# Patient Record
Sex: Male | Born: 1960 | Race: Black or African American | Hispanic: No | Marital: Married | Smoking: Current every day smoker
Health system: Southern US, Community
[De-identification: ages and names within clinical notes are randomized; demographics above are authoritative.]

## PROBLEM LIST (undated history)

## (undated) DIAGNOSIS — M1990 Unspecified osteoarthritis, unspecified site: Secondary | ICD-10-CM

---

## 1998-11-14 ENCOUNTER — Emergency Department (HOSPITAL_COMMUNITY): Admission: EM | Admit: 1998-11-14 | Discharge: 1998-11-14 | Payer: Self-pay | Admitting: Emergency Medicine

## 2014-02-26 ENCOUNTER — Emergency Department (INDEPENDENT_AMBULATORY_CARE_PROVIDER_SITE_OTHER)
Admission: EM | Admit: 2014-02-26 | Discharge: 2014-02-26 | Disposition: A | Discharge status: Home / Self Care | Payer: BC Managed Care – PPO | Source: Home / Self Care | Attending: Family Medicine | Admitting: Family Medicine

## 2014-02-26 ENCOUNTER — Encounter: Payer: Self-pay | Admitting: Emergency Medicine

## 2014-02-26 DIAGNOSIS — L0201 Cutaneous abscess of face: Secondary | ICD-10-CM

## 2014-02-26 DIAGNOSIS — L03211 Cellulitis of face: Principal | ICD-10-CM

## 2014-02-26 MED ORDER — DOXYCYCLINE HYCLATE 100 MG PO CAPS: 100.0000 mg | ORAL_CAPSULE | Freq: Two times a day (BID) | ORAL | Status: DC

## 2014-02-26 NOTE — ED Provider Notes (Signed)
CSN: [id number]     Arrival date & time 02/26/14  1913 History   First MD Initiated Contact with Patient 02/26/14 1923     Chief Complaint  Patient presents with  . Skin Problem      HPI Comments: Patient complains of onset of a painful bump on the left side of his face two days ago.  There has been no drainage from the lesion.  Patient is a 53 y.o. male presenting with rash. The history is provided by the patient.  Rash Pain location: left face. Pain quality: aching   Pain severity:  Mild Onset quality:  Sudden Duration:  2 days Timing:  Constant Progression:  Worsening Chronicity:  New Relieved by:  Nothing Worsened by:  Palpation Ineffective treatments:  None tried Associated symptoms: no chills, no fatigue and no fever     History reviewed. No pertinent past medical history. History reviewed. No pertinent past surgical history. Family History  Problem Relation Age of Onset  . Cancer Mother    History  Substance Use Topics  . Smoking status: Current Every Day Smoker -- 1.00 packs/day    Types: Cigarettes  . Smokeless tobacco: Never Used  . Alcohol Use: No    Review of Systems  Constitutional: Negative for fever, chills and fatigue.  Skin: Positive for rash.  All other systems reviewed and are negative.   Allergies  Review of patient's allergies indicates no known allergies.  Home Medications   Prior to Admission medications   Not on File   BP 137/86  Pulse 94  Temp(Src) 98.1 F (36.7 C) (Oral)  Resp 14  Ht 6\' 2"  (1.88 m)  Wt 168 lb (76.204 kg)  BMI 21.56 kg/m2  SpO2 98% Physical Exam  Nursing note and vitals reviewed. Constitutional: He appears well-developed and well-nourished. No distress.  HENT:  Head: Normocephalic.    Nose: Nose normal.  Mouth/Throat: Oropharynx is clear and moist.  Just lateral to the left eyebrow, as noted on diagram,   is a 1.5cm tender erythematous indurated nodule, not fluctuant.  No swelling or tenderness of left  eyelid.  Eyes: Conjunctivae are normal. Pupils are equal, round, and reactive to light.  Lymphadenopathy:    He has no cervical adenopathy.    ED Course  Procedures  none      MDM   1. Cellulitis and abscess of face    Begin doxycycline. Apply warm compress several times daily.  Return for worsening symptoms (pain, swelling, redness, etc.) Return if not significantly improved in 48 hours.    [name], MD 02/28/14 [phone]

## 2014-02-26 NOTE — Discharge Instructions (Signed)
Apply warm compress several times daily.  Return for worsening symptoms (pain, swelling, redness, etc.)   Cellulitis Cellulitis is an infection of the skin and the tissue beneath it. The infected area is usually red and tender. Cellulitis occurs most often in the arms and lower legs.  CAUSES  Cellulitis is caused by bacteria that enter the skin through cracks or cuts in the skin. The most common types of bacteria that cause cellulitis are Staphylococcus and Streptococcus. SYMPTOMS   Redness and warmth.  Swelling.  Tenderness or pain.  Fever. DIAGNOSIS  Your caregiver can usually determine what is wrong based on a physical exam. Blood tests may also be done. TREATMENT  Treatment usually involves taking an antibiotic medicine. HOME CARE INSTRUCTIONS   Take your antibiotics as directed. Finish them even if you start to feel better.  Keep the infected arm or leg elevated to reduce swelling.  Apply a warm cloth to the affected area up to 4 times per day to relieve pain.  Only take over-the-counter or prescription medicines for pain, discomfort, or fever as directed by your caregiver.  Keep all follow-up appointments as directed by your caregiver. SEEK MEDICAL CARE IF:   You notice red streaks coming from the infected area.  Your red area gets larger or turns dark in color.  Your bone or joint underneath the infected area becomes painful after the skin has healed.  Your infection returns in the same area or another area.  You notice a swollen bump in the infected area.  You develop new symptoms. SEEK IMMEDIATE MEDICAL CARE IF:   You have a fever.  You feel very sleepy.  You develop vomiting or diarrhea.  You have a general ill feeling (malaise) with muscle aches and pains. MAKE SURE YOU:   Understand these instructions.  Will watch your condition.  Will get help right away if you are not doing well or get worse. Document Released: 08/08/2005 Document Revised:  04/29/2012 Document Reviewed: 01/14/2012 [facility] Patient Information 2014 [place].

## 2014-02-26 NOTE — ED Notes (Signed)
Jaskaran c/o bump tp left saide of face x 2 days, started swelling today. Has not affected vision. Wears safety glasses @ work that covers this area.

## 2014-02-28 ENCOUNTER — Emergency Department (INDEPENDENT_AMBULATORY_CARE_PROVIDER_SITE_OTHER)
Admission: EM | Admit: 2014-02-28 | Discharge: 2014-02-28 | Disposition: A | Discharge status: Home / Self Care | Payer: BC Managed Care – PPO | Source: Home / Self Care | Attending: Family Medicine | Admitting: Family Medicine

## 2014-02-28 ENCOUNTER — Encounter: Payer: Self-pay | Admitting: Emergency Medicine

## 2014-02-28 DIAGNOSIS — L03211 Cellulitis of face: Principal | ICD-10-CM

## 2014-02-28 DIAGNOSIS — L0201 Cutaneous abscess of face: Secondary | ICD-10-CM

## 2014-02-28 NOTE — Discharge Instructions (Signed)
Leave bandage in place until follow-up visit tomorrow.  Continue antibiotic.  May take Tylenol for pain.   Abscess An abscess is an infected area that contains a collection of pus and debris.It can occur in almost any part of the body. An abscess is also known as a furuncle or boil. CAUSES  An abscess occurs when tissue gets infected. This can occur from blockage of oil or sweat glands, infection of hair follicles, or a minor injury to the skin. As the body tries to fight the infection, pus collects in the area and creates pressure under the skin. This pressure causes pain. People with weakened immune systems have difficulty fighting infections and get certain abscesses more often.  SYMPTOMS Usually an abscess develops on the skin and becomes a painful mass that is red, warm, and tender. If the abscess forms under the skin, you may feel a moveable soft area under the skin. Some abscesses break open (rupture) on their own, but most will continue to get worse without care. The infection can spread deeper into the body and eventually into the bloodstream, causing you to feel ill.  DIAGNOSIS  Your caregiver will take your medical history and perform a physical exam. A sample of fluid may also be taken from the abscess to determine what is causing your infection. TREATMENT  Your caregiver may prescribe antibiotic medicines to fight the infection. However, taking antibiotics alone usually does not cure an abscess. Your caregiver may need to make a small cut (incision) in the abscess to drain the pus. In some cases, gauze is packed into the abscess to reduce pain and to continue draining the area. HOME CARE INSTRUCTIONS   Only take over-the-counter or prescription medicines for pain, discomfort, or fever as directed by your caregiver.  If you were prescribed antibiotics, take them as directed. Finish them even if you start to feel better.  If gauze is used, follow your caregiver's directions for  changing the gauze.  To avoid spreading the infection:  Keep your draining abscess covered with a bandage.  Wash your hands well.  Do not share personal care items, towels, or whirlpools with others.  Avoid skin contact with others.  Keep your skin and clothes clean around the abscess.  Keep all follow-up appointments as directed by your caregiver. SEEK MEDICAL CARE IF:   You have increased pain, swelling, redness, fluid drainage, or bleeding.  You have muscle aches, chills, or a general ill feeling.  You have a fever. MAKE SURE YOU:   Understand these instructions.  Will watch your condition.  Will get help right away if you are not doing well or get worse. Document Released: 08/08/2005 Document Revised: 04/29/2012 Document Reviewed: 01/11/2012 [facility] Patient Information 2014 [place].

## 2014-02-28 NOTE — ED Notes (Signed)
Pt is back today for a bump on his face near his L eye.  He was seen 2 days ago and it has not improved but has not gotten worse.  Sore to touch

## 2014-02-28 NOTE — ED Provider Notes (Signed)
CSN: [id number]     Arrival date & time 02/28/14  1110 History   First MD Initiated Contact with Patient 02/28/14 1143     Chief Complaint  Patient presents with  . Mass     HPI Comments: Patient returns for follow-up of a cystic lesion on his left face.  He reports that the lesion is slightly larger and more painful.  He feels well otherwise.                                                                                                                                                                                      The history is provided by the patient.    History reviewed. No pertinent past medical history. History reviewed. No pertinent past surgical history. Family History  Problem Relation Age of Onset  . Cancer Mother    History  Substance Use Topics  . Smoking status: Current Every Day Smoker -- 1.00 packs/day    Types: Cigarettes  . Smokeless tobacco: Never Used  . Alcohol Use: No    Review of Systems No fevers, chills, and sweats  Allergies  Review of patient's allergies indicates no known allergies.  Home Medications   Prior to Admission medications   Medication Sig Start Date End Date Taking? Authorizing Provider  doxycycline (VIBRAMYCIN) 100 MG capsule Take 1 capsule (100 mg total) by mouth 2 (two) times daily. 02/26/14   [name], MD   BP 121/78  Pulse 90  Temp(Src) 97.7 F (36.5 C) (Oral)  Ht 6\' 2"  (1.88 m)  Wt 168 lb 8 oz (76.431 kg)  BMI 21.62 kg/m2  SpO2 97% Physical Exam Nursing notes and Vital Signs reviewed. Appearance:  Patient appears healthy, stated age, and in no acute distress Eyes:  Pupils are equal, round, and reactive to light and accomodation.  Extraocular movement is intact.  Conjunctivae are not inflamed.  No left eyelid tenderness or swelling. Head:  There is a persistent 1.5cm dia tender fluctuant erythematous cystic lesion lateral to the left eye as noted in previous note.  .   ED Course  Procedures  Incise and drain  cyst/abscess Risks and benefits of procedure explained to patient and verbal consent obtained.  Using sterile technique and local anesthesia with 1% lidocaine with epinephrine, cleansed affected area with Betadine and alcohol. Identified the most fluctuant area of lesion and incised with #11 blade.  Expressed blood and purulent material.  Inserted Iodoform gauze packing.  Bandage applied.  Patient tolerated well       MDM   1. Cellulitis and abscess of face     Leave bandage in place until follow-up visit tomorrow.  Continue antibiotic.  May take Tylenol for pain. Return tomorrow.    [name], MD 02/28/14 [phone]

## 2014-03-01 ENCOUNTER — Encounter: Payer: Self-pay | Admitting: Emergency Medicine

## 2014-03-01 ENCOUNTER — Emergency Department
Admission: EM | Admit: 2014-03-01 | Discharge: 2014-03-01 | Disposition: A | Discharge status: Home / Self Care | Payer: BC Managed Care – PPO | Source: Home / Self Care | Attending: Family Medicine | Admitting: Family Medicine

## 2014-03-01 DIAGNOSIS — Z4801 Encounter for change or removal of surgical wound dressing: Secondary | ICD-10-CM

## 2014-03-01 NOTE — Discharge Instructions (Signed)
Change dressing daily until healed.  Continue antibiotic

## 2014-03-01 NOTE — ED Notes (Signed)
The pt is here today for a recheck of his facial abscess from 02/28/14.

## 2014-03-01 NOTE — ED Provider Notes (Signed)
CSN: [id number]     Arrival date & time 03/01/14  55 History   First MD Initiated Contact with Patient 03/01/14 1810     Chief Complaint  Patient presents with  . Wound Check      HPI Comments: Patient returns for wound packing removal and dressing change.  He has no complaints.  The history is provided by the patient.    History reviewed. No pertinent past medical history. History reviewed. No pertinent past surgical history. Family History  Problem Relation Age of Onset  . Cancer Mother    History  Substance Use Topics  . Smoking status: Current Every Day Smoker -- 1.00 packs/day    Types: Cigarettes  . Smokeless tobacco: Never Used  . Alcohol Use: No    Review of Systems No fevers, chills, and sweats.  Patient reports no pain at I and D site left face. Allergies  Review of patient's allergies indicates no known allergies.  Home Medications   Prior to Admission medications   Medication Sig Start Date End Date Taking? Authorizing Provider  doxycycline (VIBRAMYCIN) 100 MG capsule Take 1 capsule (100 mg total) by mouth 2 (two) times daily. 02/26/14   [name], MD   BP 114/76  Pulse 86  Temp(Src) 98.6 F (37 C) (Oral)  Resp 16  SpO2 96% Physical Exam Nursing notes and Vital Signs reviewed. Appearance:  Patient appears healthy, stated age, and in no acute distress Head:  I and D site left face with minimal erythema, swelling, and tenderness.  Packing removed:  Small amount of purulent drainage present, and wound cavity shallow.  No further packing indicated.  Bandage applied. ED Course  Procedures  none      MDM   1. Dressing change or removal, surgical wound    Wound culture pending. Continue doxycycline.  Change dressing daily.  Return if worsens or not healed 5 to 7 days.    [name], MD 03/01/14 [phone]

## 2014-03-04 LAB — WOUND CULTURE: GRAM STAIN: NONE SEEN

## 2014-03-05 ENCOUNTER — Telehealth: Payer: Self-pay | Admitting: *Deleted

## 2014-04-20 ENCOUNTER — Emergency Department
Admission: EM | Admit: 2014-04-20 | Discharge: 2014-04-20 | Disposition: A | Discharge status: Home / Self Care | Payer: BC Managed Care – PPO | Source: Home / Self Care | Attending: Emergency Medicine | Admitting: Emergency Medicine

## 2014-04-20 ENCOUNTER — Encounter: Payer: Self-pay | Admitting: Emergency Medicine

## 2014-04-20 DIAGNOSIS — J209 Acute bronchitis, unspecified: Secondary | ICD-10-CM

## 2014-04-20 MED ORDER — AZITHROMYCIN 250 MG PO TABS: ORAL_TABLET | ORAL | Status: DC

## 2014-04-20 NOTE — ED Notes (Signed)
Pt c/o chest congestion with some coughing x 2 days. He is unsure if he has had a fever. He has taken Dayquil and Nyquil with some relief.

## 2014-04-20 NOTE — ED Provider Notes (Signed)
CSN: [id number]     Arrival date & time 04/20/14  1838 History   First MD Initiated Contact with Patient 04/20/14 1840     Chief Complaint  Patient presents with  . Cough    HPI URI HISTORY  Micheal Williams is a 53 y.o. male who complains of onset of cold symptoms for several days. Progressively worsening.  Have been using over-the-counter treatment , such as DayQuil, and NyQuil at bedtime, which helps . Cough does not keep him up at night.  He denies sneezing or allergy symptoms or itchy watery eyes  No chills/sweats +  Fever  +  Nasal congestion +  Discolored Post-nasal drainage No sinus pain/pressure No sore throat  +  Cough, productive of discolored sputum No wheezing + chest congestion No hemoptysis No shortness of breath No pleuritic pain  No itchy/red eyes No earache  No nausea No vomiting No abdominal pain No diarrhea  No skin rashes +  Fatigue No myalgias No headache   History reviewed. No pertinent past medical history. History reviewed. No pertinent past surgical history. Family History  Problem Relation Age of Onset  . Cancer Mother    History  Substance Use Topics  . Smoking status: Current Every Day Smoker -- 1.00 packs/day    Types: Cigarettes  . Smokeless tobacco: Never Used  . Alcohol Use: No    Review of Systems  All other systems reviewed and are negative.   Allergies  Review of patient's allergies indicates no known allergies.  Home Medications   Prior to Admission medications   Medication Sig Start Date End Date Taking? Authorizing Provider  azithromycin (ZITHROMAX Z-PAK) 250 MG tablet Take 2 tablets on day one, then 1 tablet daily on days 2 through 5 04/20/14   Jacqulyn Cane, MD   BP 128/86  Pulse 73  Temp(Src) 98.1 F (36.7 C) (Oral)  Resp 18  Ht 6\' 2"  (1.88 m)  Wt 170 lb (77.111 kg)  BMI 21.82 kg/m2  SpO2 98% Physical Exam  Nursing note and vitals reviewed. Constitutional: He is oriented to person, place, and time. He  appears well-developed and well-nourished. No distress.  HENT:  Head: Normocephalic and atraumatic.  Right Ear: Tympanic membrane normal.  Left Ear: Tympanic membrane normal.  Nose: Right sinus exhibits no maxillary sinus tenderness and no frontal sinus tenderness. Left sinus exhibits no maxillary sinus tenderness and no frontal sinus tenderness.  Mouth/Throat: Oropharynx is clear and moist. No oropharyngeal exudate.  Nose, within normal limits except some boggy turbinates and slight seromucoid drainage.  Eyes: Right eye exhibits no discharge. Left eye exhibits no discharge. No scleral icterus.  Neck: Neck supple.  Cardiovascular: Normal rate, regular rhythm and normal heart sounds.   Pulmonary/Chest: No respiratory distress. He has no wheezes. He has rhonchi. He has no rales.  Lymphadenopathy:    He has no cervical adenopathy.  Neurological: He is alert and oriented to person, place, and time.  Skin: Skin is warm and dry.    ED Course  Procedures (including critical care time) Labs Review Labs Reviewed - No data to display  Imaging Review No results found.   MDM   1. Acute bronchitis    Treatment options discussed, as well as risks, benefits, alternatives. Patient voiced understanding and agreement with the following plans: Z-Pak for bacterial and atypical coverage He declined any prescription cough medication, and he'll continue DayQuil and NyQuil as needed. I advised him to quit smoking. Follow-up with your primary care doctor in 5-7 days  if not improving, or sooner if symptoms become worse. Precautions discussed. Red flags discussed. Questions invited and answered. Patient voiced understanding and agreement.     Jacqulyn Cane, MD 04/20/14 Drema Halon

## 2014-04-25 ENCOUNTER — Emergency Department (INDEPENDENT_AMBULATORY_CARE_PROVIDER_SITE_OTHER): Payer: BC Managed Care – PPO

## 2014-04-25 ENCOUNTER — Encounter: Payer: Self-pay | Admitting: Emergency Medicine

## 2014-04-25 ENCOUNTER — Emergency Department (INDEPENDENT_AMBULATORY_CARE_PROVIDER_SITE_OTHER)
Admission: EM | Admit: 2014-04-25 | Discharge: 2014-04-25 | Disposition: A | Discharge status: Home / Self Care | Payer: BC Managed Care – PPO | Source: Home / Self Care | Attending: Emergency Medicine | Admitting: Emergency Medicine

## 2014-04-25 DIAGNOSIS — F17200 Nicotine dependence, unspecified, uncomplicated: Secondary | ICD-10-CM

## 2014-04-25 DIAGNOSIS — R059 Cough, unspecified: Secondary | ICD-10-CM

## 2014-04-25 DIAGNOSIS — J441 Chronic obstructive pulmonary disease with (acute) exacerbation: Secondary | ICD-10-CM

## 2014-04-25 DIAGNOSIS — R05 Cough: Secondary | ICD-10-CM

## 2014-04-25 MED ORDER — IPRATROPIUM-ALBUTEROL 0.5-2.5 (3) MG/3ML IN SOLN
3.0000 mL | Freq: Once | RESPIRATORY_TRACT | Status: AC
  Administered 2014-04-25: 3 mL via RESPIRATORY_TRACT

## 2014-04-25 MED ORDER — PREDNISONE 20 MG PO TABS: 20.0000 mg | ORAL_TABLET | Freq: Two times a day (BID) | ORAL | Status: DC

## 2014-04-25 MED ORDER — METHYLPREDNISOLONE ACETATE 80 MG/ML IJ SUSP
80.0000 mg | Freq: Once | INTRAMUSCULAR | Status: AC
  Administered 2014-04-25: 80 mg via INTRAMUSCULAR

## 2014-04-25 NOTE — ED Notes (Signed)
Pt complains of chest congestion was seen Tuesday and finished zpak not feeling better. Denies fever headaches chills.  Cough present and is productive white.

## 2014-04-25 NOTE — Discharge Instructions (Signed)
Today, chest x-ray within normal limits except early signs of emphysema (from smoking).  Treatment today in urgent care: Nebulizer treatment with bronchodilator. Depo-Medrol 80 mg shot.   Prescription: Prednisone 20 mg twice a day x7 days   QUITTING SMOKING SHOULD IMPROVE YOUR BREATHING AND CHEST CONGESTION. Follow-up with your primary care doctor in 5-7 days.

## 2014-04-25 NOTE — ED Provider Notes (Addendum)
CSN: [id number]     Arrival date & time 04/25/14  1440 History   First MD Initiated Contact with Patient 04/25/14 1441     Chief Complaint  Patient presents with  . Cough    HPI Complains of worsening chest congestion cough productive of whitish sputum. Was seen here 5 days ago, Z-Pak prescribed, and he feels that that hasn't helped significantly. He feels fatigued but no syncope or focal neurologic symptoms. Occasional mild dyspnea on exertion . No known wheezing. Denies chest pain. Denies headache, fever, chills.  He smokes a pack a day  + minimal Nasal congestion No Discolored Post-nasal drainage No sinus pain/pressure No sore throat  +  cough No wheezing + chest congestion No hemoptysis No shortness of breath No pleuritic pain  No itchy/red eyes No earache  No nausea No vomiting No abdominal pain No diarrhea  No skin rashes +  Fatigue No myalgias No headache   History reviewed. No pertinent past medical history. History reviewed. No pertinent past surgical history. Family History  Problem Relation Age of Onset  . Cancer Mother    History  Substance Use Topics  . Smoking status: Current Every Day Smoker -- 1.00 packs/day    Types: Cigarettes  . Smokeless tobacco: Never Used  . Alcohol Use: No    Review of Systems  All other systems reviewed and are negative.   Allergies  Review of patient's allergies indicates no known allergies.  Home Medications   Prior to Admission medications   Medication Sig Start Date End Date Taking? Authorizing Provider  azithromycin (ZITHROMAX Z-PAK) 250 MG tablet Take 2 tablets on day one, then 1 tablet daily on days 2 through 5 04/20/14   Jacqulyn Cane, MD  predniSONE (DELTASONE) 20 MG tablet Take 1 tablet (20 mg total) by mouth 2 (two) times daily with a meal. X 7 days 04/25/14   Jacqulyn Cane, MD   BP 125/82  Pulse 87  Temp(Src) 98.1 F (36.7 C) (Oral)  Ht 6\' 2"  (1.88 m)  Wt 165 lb (74.844 kg)  BMI 21.18 kg/m2  SpO2  96% Physical Exam  Nursing note and vitals reviewed. Constitutional: He is oriented to person, place, and time. He appears well-developed and well-nourished. No distress.  HENT:  Head: Normocephalic and atraumatic.  Right Ear: Tympanic membrane normal.  Left Ear: Tympanic membrane normal.  Nose: Right sinus exhibits no maxillary sinus tenderness and no frontal sinus tenderness. Left sinus exhibits no maxillary sinus tenderness and no frontal sinus tenderness.  Mouth/Throat: Oropharynx is clear and moist. No oropharyngeal exudate.  Nose, within normal limits except some boggy turbinates and slight seromucoid drainage.  Eyes: Right eye exhibits no discharge. Left eye exhibits no discharge. No scleral icterus.  Neck: Neck supple.  No adenopathy  Cardiovascular: Normal rate, regular rhythm and normal heart sounds.   Pulmonary/Chest: No respiratory distress. He has no wheezes. He has rhonchi. He has no rales.  Diffuse rhonchi. Mild late expiratory wheezes throughout  Lymphadenopathy:    He has no cervical adenopathy.  Neurological: He is alert and oriented to person, place, and time.  Skin: Skin is warm and dry.   Extremities: No calf tenderness or swelling or cords ED Course  Procedures (including critical care time) Labs Review Labs Reviewed - No data to display  Imaging Review Dg Chest 2 View  04/25/2014   CLINICAL DATA:  Cough, smoker.  EXAM: CHEST  2 VIEW  COMPARISON:  None.  FINDINGS: Hyperinflation of the lungs. Heart and  mediastinal contours are within normal limits. No focal opacities or effusions. No acute bony abnormality.  IMPRESSION: No active cardiopulmonary disease.  Hyperinflation.   Electronically Signed   By: Rolm Baptise M.D.   On: 04/25/2014 15:26     MDM   1. Acute exacerbation of chronic obstructive pulmonary disease (COPD)     Reviewed with him chest x-ray shows no active disease, but hyperinflation consistent with mild COPD. He likely has acute  bronchitis/exacerbation of COPD.--In my opinion, he does not need another course of antibiotic, as he just completed a Z-Pak, which continues to treat for another 5 days.(Also, no fever. Sputum without discoloration or blood)  Treatment options discussed, as well as risks, benefits, alternatives. Patient voiced understanding and agreement with the following plans: Depo-Medrol 80 mg IM stat DuoNeb nebulizer treatment given. Wheezing improved.--He felt much better. Prednisone oral burst 20 mg twice a day x7 days  I urged him to quit smoking and explained risks of not doing so.  An After Visit Summary was printed and given to the patient.  Follow-up with your primary care doctor in 5 days, or sooner if symptoms become worse. Precautions discussed. Red flags discussed. Questions invited and answered. Patient voiced understanding and agreement.    Jacqulyn Cane, MD 04/25/14 1555  Jacqulyn Cane, MD 04/25/14 [phone]

## 2014-08-18 ENCOUNTER — Emergency Department
Admission: EM | Admit: 2014-08-18 | Discharge: 2014-08-18 | Disposition: A | Discharge status: Home / Self Care | Payer: BC Managed Care – PPO | Source: Home / Self Care | Attending: Emergency Medicine | Admitting: Emergency Medicine

## 2014-08-18 ENCOUNTER — Encounter: Payer: Self-pay | Admitting: Emergency Medicine

## 2014-08-18 ENCOUNTER — Emergency Department (INDEPENDENT_AMBULATORY_CARE_PROVIDER_SITE_OTHER): Payer: BC Managed Care – PPO

## 2014-08-18 DIAGNOSIS — M25442 Effusion, left hand: Secondary | ICD-10-CM

## 2014-08-18 DIAGNOSIS — M79642 Pain in left hand: Secondary | ICD-10-CM

## 2014-08-18 MED ORDER — PREDNISONE 10 MG PO TABS: ORAL_TABLET | ORAL | Status: DC

## 2014-08-18 MED ORDER — HYDROCODONE-ACETAMINOPHEN 5-325 MG PO TABS: 2.0000 | ORAL_TABLET | ORAL | Status: DC | PRN

## 2014-08-18 MED ORDER — IBUPROFEN 800 MG PO TABS: 800.0000 mg | ORAL_TABLET | Freq: Three times a day (TID) | ORAL | Status: DC

## 2014-08-18 NOTE — Discharge Instructions (Signed)

## 2014-08-18 NOTE — ED Notes (Signed)
Left hand pain since yesterday, can't make a fist, can't sleep, hurts worse when laying down 8/10, throbbing

## 2014-08-18 NOTE — ED Provider Notes (Signed)
CSN: [id number]     Arrival date & time 08/18/14  1253 History   First MD Initiated Contact with Patient 08/18/14 1331     Chief Complaint  Patient presents with  . Hand Pain   (Consider location/radiation/quality/duration/timing/severity/associated sxs/prior Treatment) Patient is a 53 y.o. male presenting with hand injury. The history is provided by the patient. No language interpreter was used.  Hand Injury Location:  Arm Time since incident:  1 day Injury: no   Arm location:  L upper arm Pain details:    Quality:  Aching   Severity:  Moderate   Onset quality:  Gradual   Duration:  2 days   Timing:  Constant   Progression:  Worsening Chronicity:  New Dislocation: no   Foreign body present:  No foreign bodies Tetanus status:  Out of date Prior injury to area:  No Relieved by:  Nothing Worsened by:  Nothing tried Ineffective treatments:  None tried Associated symptoms: swelling     History reviewed. No pertinent past medical history. History reviewed. No pertinent past surgical history. Family History  Problem Relation Age of Onset  . Cancer Mother    History  Substance Use Topics  . Smoking status: Current Every Day Smoker -- 1.00 packs/day    Types: Cigarettes  . Smokeless tobacco: Never Used  . Alcohol Use: No    Review of Systems  Musculoskeletal: Positive for joint swelling and myalgias.  All other systems reviewed and are negative.   Allergies  Review of patient's allergies indicates not on file.  Home Medications   Prior to Admission medications   Medication Sig Start Date End Date Taking? Authorizing Provider  ibuprofen (ADVIL,MOTRIN) 200 MG tablet Take 200 mg by mouth every 6 (six) hours as needed.   Yes Historical Provider, MD  azithromycin (ZITHROMAX Z-PAK) 250 MG tablet Take 2 tablets on day one, then 1 tablet daily on days 2 through 5 04/20/14   Jacqulyn Cane, MD  predniSONE (DELTASONE) 20 MG tablet Take 1 tablet (20 mg total) by mouth 2 (two)  times daily with a meal. X 7 days 04/25/14   Jacqulyn Cane, MD   BP 120/86  Pulse 79  Temp(Src) 98.1 F (36.7 C) (Oral)  Ht 6\' 2"  (1.88 m)  Wt 169 lb (76.658 kg)  BMI 21.69 kg/m2  SpO2 97% Physical Exam  Vitals reviewed. Constitutional: He is oriented to person, place, and time. He appears well-developed and well-nourished.  Musculoskeletal: He exhibits tenderness.  Swollen left hand,  From all joints,  Pain with moving. nv and ns intact  Neurological: He is alert and oriented to person, place, and time. He has normal reflexes.  Skin: Skin is warm. There is erythema.  Psychiatric: He has a normal mood and affect.    ED Course  Procedures (including critical care time) Labs Review Labs Reviewed - No data to display  Imaging Review Dg Hand Complete Left  08/18/2014   CLINICAL DATA:  Atraumatic left hand pain and swelling since yesterday; initial visit  EXAM: LEFT HAND - COMPLETE 3+ VIEW  COMPARISON:  None.  FINDINGS: The bones are adequately mineralized. 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 There is no acute fracture nor dislocation. The soft tissues exhibit no abnormal densities.  IMPRESSION: There is no acute bony abnormality of the left hand.   Electronically Signed   By: David  Martinique   On: 08/18/2014 14:16     MDM xrays normal.  Gout  vs arthritis.    1. Swelling of hand joint, left    Prednisone taper Ibuprofen Hydrocodone Schedule to see Dr. Darene Lamer for recheck   Fransico Meadow, PA-C 08/18/14 1723

## 2014-08-19 NOTE — ED Provider Notes (Signed)
Medical history/examination/treatment/procedure(s) were performed by non-physician provider and as supervising physician I was immediately available for consultation/collaboration.   Jacqulyn Cane, MD 08/19/14 [phone]

## 2014-08-22 ENCOUNTER — Telehealth: Payer: Self-pay | Admitting: Emergency Medicine

## 2014-08-22 NOTE — ED Notes (Signed)
Inquired about patient's status; encourage them to call with questions/concerns.  

## 2014-09-04 ENCOUNTER — Emergency Department
Admission: EM | Admit: 2014-09-04 | Discharge: 2014-09-04 | Disposition: A | Discharge status: Home / Self Care | Payer: BC Managed Care – PPO | Source: Home / Self Care | Attending: Emergency Medicine | Admitting: Emergency Medicine

## 2014-09-04 ENCOUNTER — Encounter: Payer: Self-pay | Admitting: Emergency Medicine

## 2014-09-04 DIAGNOSIS — J441 Chronic obstructive pulmonary disease with (acute) exacerbation: Secondary | ICD-10-CM

## 2014-09-04 HISTORY — DX: Unspecified osteoarthritis, unspecified site: M19.90

## 2014-09-04 MED ORDER — AZITHROMYCIN 250 MG PO TABS: ORAL_TABLET | ORAL | Status: DC

## 2014-09-04 MED ORDER — PREDNISONE 20 MG PO TABS: 20.0000 mg | ORAL_TABLET | Freq: Two times a day (BID) | ORAL | Status: DC

## 2014-09-04 MED ORDER — IPRATROPIUM-ALBUTEROL 0.5-2.5 (3) MG/3ML IN SOLN
3.0000 mL | RESPIRATORY_TRACT | Status: AC
  Administered 2014-09-04: 3 mL via RESPIRATORY_TRACT

## 2014-09-04 MED ORDER — METHYLPREDNISOLONE ACETATE 80 MG/ML IJ SUSP
80.0000 mg | Freq: Once | INTRAMUSCULAR | Status: AC
  Administered 2014-09-04: 80 mg via INTRAMUSCULAR

## 2014-09-04 NOTE — ED Provider Notes (Signed)
CSN: [id number]     Arrival date & time 09/04/14  1133 History   First MD Initiated Contact with Patient 09/04/14 1146     Chief Complaint  Patient presents with  . Cough  . Nasal Congestion   (Consider location/radiation/quality/duration/timing/severity/associated sxs/prior Treatment) HPI today has productive (yellow) cough and congestion without fever. Back in June, he had similar symptoms, diagnosis of exacerbation of COPD, and his symptoms immediately improved with Depo-Medrol 80 mg, DuoNeb treatment, short prednisone burst, and Z-Pak--he specifically requests those as treatment today.  No chills/sweats +  Nasal congestion +  Discolored Post-nasal drainage No sinus pain/pressure No sore throat  +  cough occasional wheezing--he continues to smoke cigarettes, although I advised him to quit Positive chest congestion No hemoptysis No shortness of breath No pleuritic pain  No itchy/red eyes No earache  No nausea No vomiting No abdominal pain No diarrhea  No skin rashes +  Fatigue No myalgias No headache    Past Medical History  Diagnosis Date  . Arthritis    History reviewed. No pertinent past surgical history. Family History  Problem Relation Age of Onset  . Cancer Mother    History  Substance Use Topics  . Smoking status: Current Every Day Smoker -- 1.00 packs/day    Types: Cigarettes  . Smokeless tobacco: Never Used  . Alcohol Use: No    Review of Systems  All other systems reviewed and are negative.   Allergies  Review of patient's allergies indicates not on file.  Home Medications   Prior to Admission medications   Medication Sig Start Date End Date Taking? Authorizing Provider  azithromycin (ZITHROMAX Z-PAK) 250 MG tablet Take 2 tablets on day one, then 1 tablet daily on days 2 through 5 09/04/14   Jacqulyn Cane, MD  ibuprofen (ADVIL,MOTRIN) 200 MG tablet Take 200 mg by mouth every 6 (six) hours as needed.    Historical Provider, MD   ibuprofen (ADVIL,MOTRIN) 800 MG tablet Take 1 tablet (800 mg total) by mouth 3 (three) times daily. 08/18/14   Fransico Meadow, PA-C  predniSONE (DELTASONE) 20 MG tablet Take 1 tablet (20 mg total) by mouth 2 (two) times daily with a meal. 09/04/14   Jacqulyn Cane, MD   BP 111/69  Pulse 110  Temp(Src) 98 F (36.7 C) (Oral)  Resp 18  SpO2 96% Physical Exam  Nursing note and vitals reviewed. Constitutional: He is oriented to person, place, and time. He appears well-developed and well-nourished. No distress.  HENT:  Head: Normocephalic and atraumatic.  Right Ear: Tympanic membrane normal.  Left Ear: Tympanic membrane normal.  Nose: Nose normal.  Mouth/Throat: Oropharynx is clear and moist. No oropharyngeal exudate.  Eyes: Right eye exhibits no discharge. Left eye exhibits no discharge. No scleral icterus.  Neck: Neck supple.  Cardiovascular: Normal rate, regular rhythm and normal heart sounds.   Pulmonary/Chest: No respiratory distress. He has wheezes. He has rhonchi. He has no rales.  Lymphadenopathy:    He has no cervical adenopathy.  Neurological: He is alert and oriented to person, place, and time.  Skin: Skin is warm and dry.    ED Course  Procedures (including critical care time) Labs Review Labs Reviewed - No data to display  Imaging Review No results found.   MDM   1. Acute exacerbation of chronic obstructive pulmonary disease (COPD)    Depo-Medrol 80 mg IM stat  DuoNeb nebulizer treatment given. Wheezing improved.--He felt much better.  Prednisone oral burst 20 mg twice a  day x7 days Z-Pak Advised to quit smoking Other symptomatic care discussed Follow-up with your primary care doctor in 5-7 days if not improving, or sooner if symptoms become worse. Precautions discussed. Red flags discussed. Questions invited and answered. Patient voiced understanding and agreement.    Jacqulyn Cane, MD 09/04/14 1300

## 2014-09-04 NOTE — ED Notes (Signed)
States had Influenza vaccination 2 days ago; today has productive (yellow) cough and congestion without fever.

## 2014-09-15 IMAGING — CR DG CHEST 2V
2 series · 2 of 2 positions shown · non-contrast
Comparison: None.

CLINICAL DATA: Cough, smoker.

EXAM:
CHEST  2 VIEW

[view not recorded (1 of 2)]
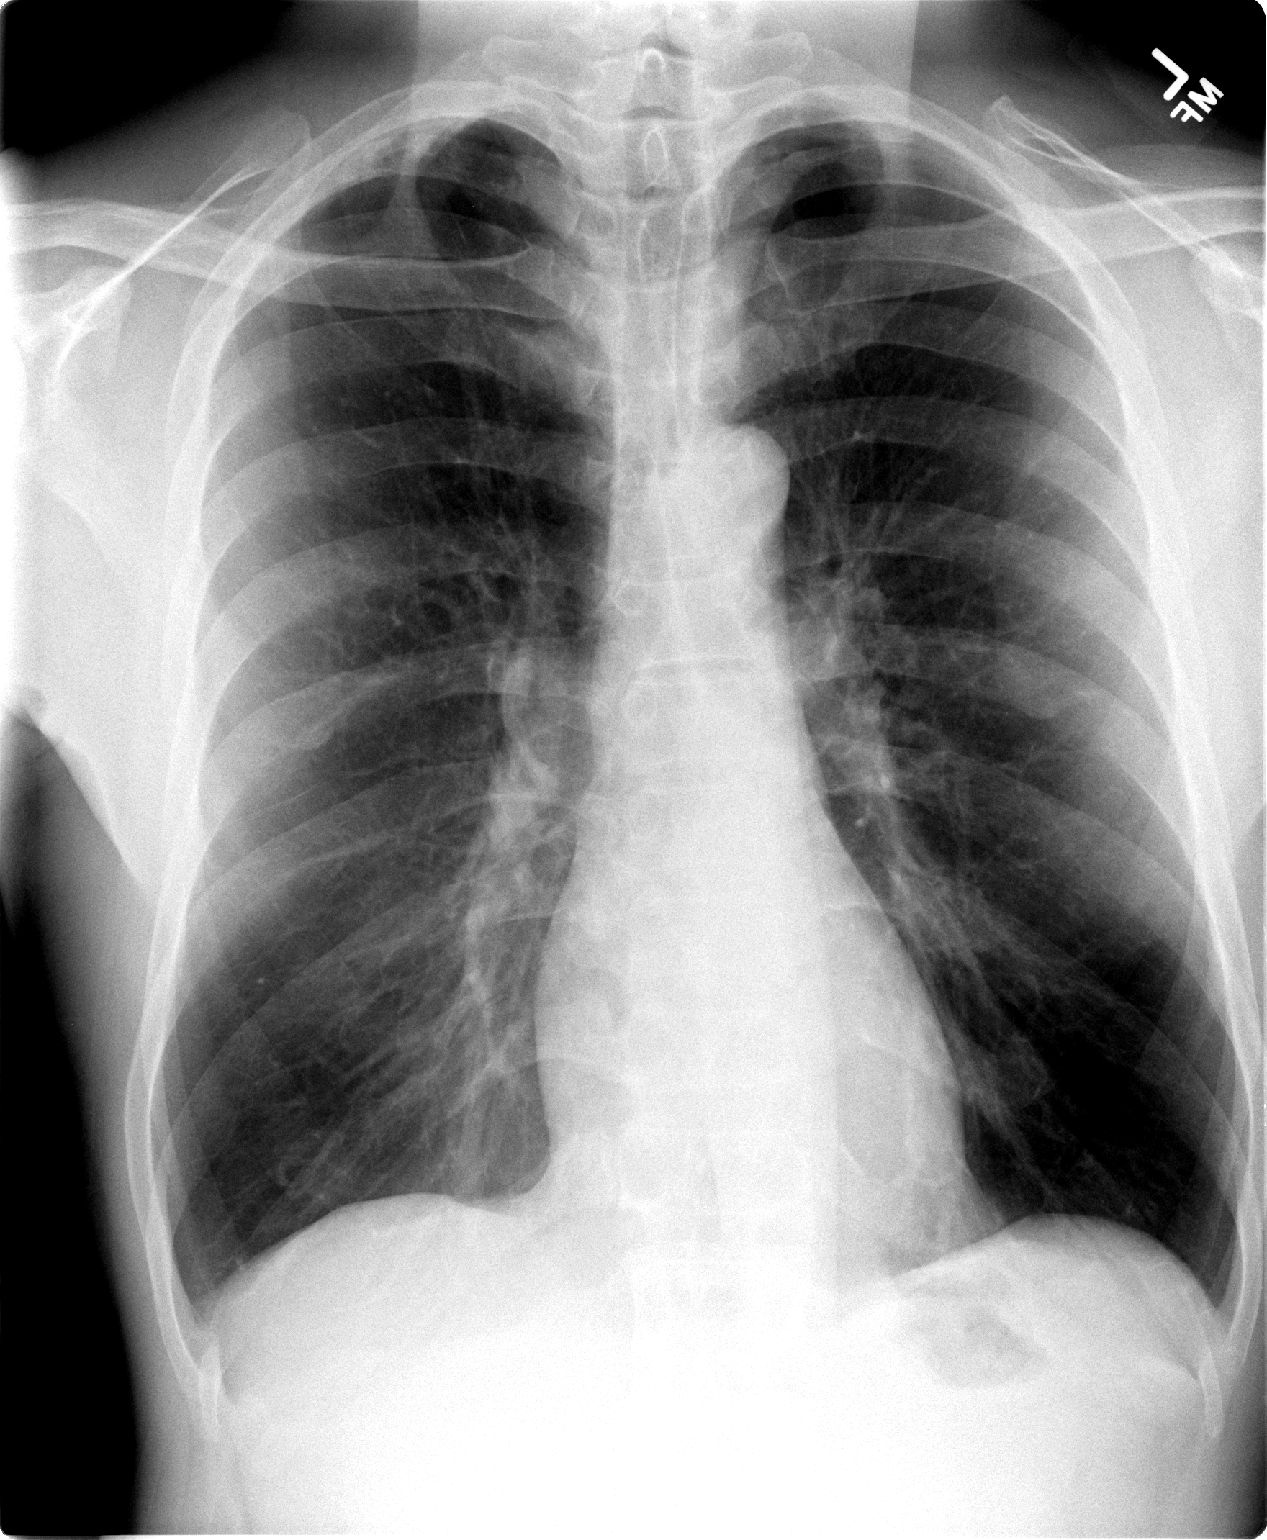

[view not recorded (2 of 2)]
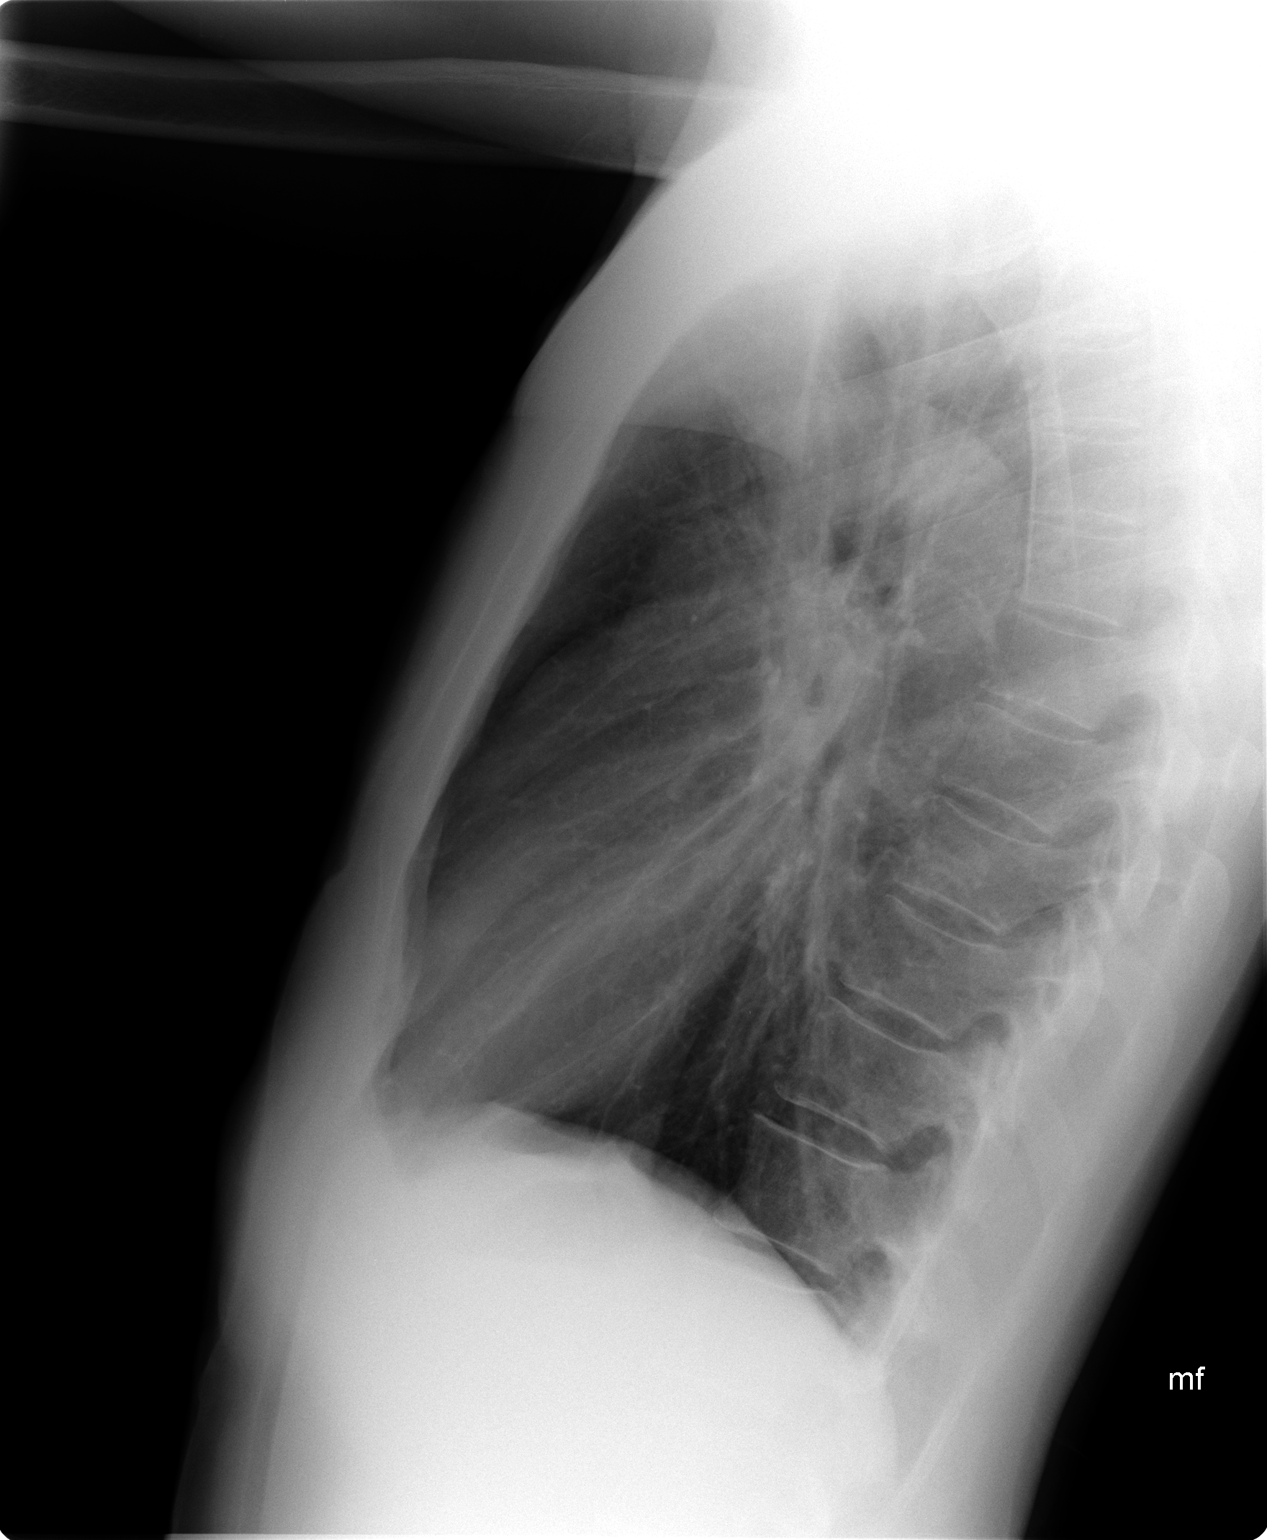

[2 of 2 positions shown; findings below may reference images not displayed]

FINDINGS: Hyperinflation of the lungs. Heart and mediastinal contours are
within normal limits. No focal opacities or effusions. No acute bony
abnormality.
IMPRESSION: No active cardiopulmonary disease.  Hyperinflation.

## 2016-01-13 ENCOUNTER — Encounter: Payer: Self-pay | Admitting: *Deleted

## 2016-01-13 ENCOUNTER — Emergency Department
Admission: EM | Admit: 2016-01-13 | Discharge: 2016-01-13 | Disposition: A | Discharge status: Home / Self Care | Payer: BLUE CROSS/BLUE SHIELD | Source: Home / Self Care | Attending: Family Medicine | Admitting: Family Medicine

## 2016-01-13 DIAGNOSIS — J0120 Acute ethmoidal sinusitis, unspecified: Secondary | ICD-10-CM

## 2016-01-13 MED ORDER — FLUTICASONE PROPIONATE 50 MCG/ACT NA SUSP: NASAL | Status: DC

## 2016-01-13 MED ORDER — AZITHROMYCIN 250 MG PO TABS: ORAL_TABLET | ORAL | Status: DC

## 2016-01-13 MED ORDER — PREDNISONE 20 MG PO TABS: 20.0000 mg | ORAL_TABLET | Freq: Two times a day (BID) | ORAL | Status: DC

## 2016-01-13 NOTE — ED Notes (Signed)
Pt c/o sinus HA, pressure and congestion. Afebrile. Taken Claritin otc.

## 2016-01-13 NOTE — Discharge Instructions (Signed)
Take Pseudoephedrine (30mg , one or two every 4 to 6 hours) for sinus congestion.  Get adequate rest.   May use Afrin nasal spray (or generic oxymetazoline) twice daily for about 5 days and then discontinue.  Also recommend using saline nasal spray several times daily and saline nasal irrigation (AYR is a common brand).  Use Flonase nasal spray each morning after using Afrin nasal spray and saline nasal irrigation. Stop all antihistamines for now, and other non-prescription cough/cold preparations.

## 2016-01-13 NOTE — ED Provider Notes (Addendum)
CSN: [id number]     Arrival date & time 01/13/16  1833 History   First MD Initiated Contact with Patient 01/13/16 2007     Chief Complaint  Patient presents with  . Sinus Problem  . Headache      HPI Comments: Patient complains of one week history of sinus congestion and sensation of ears clogged.  He has developed pressure-like pain behind his nose.  No sore throat.  No fevers, chills, and sweats.  The history is provided by the patient.    Past Medical History  Diagnosis Date  . Arthritis    History reviewed. No pertinent past surgical history. Family History  Problem Relation Age of Onset  . Cancer Mother    Social History  Substance Use Topics  . Smoking status: Current Every Day Smoker -- 1.00 packs/day    Types: Cigarettes  . Smokeless tobacco: Never Used  . Alcohol Use: No    Review of Systems No sore throat No cough + sneezing No pleuritic pain No wheezing + nasal congestion + post-nasal drainage + sinus pain/pressure No itchy/red eyes No earache No hemoptysis No SOB No fever/chills No nausea No vomiting No abdominal pain No diarrhea No urinary symptoms No skin rash No fatigue No myalgias + headache Used OTC meds without relief  Allergies  Review of patient's allergies indicates no known allergies.  Home Medications   Prior to Admission medications   Medication Sig Start Date End Date Taking? Authorizing Provider  azithromycin (ZITHROMAX Z-PAK) 250 MG tablet Take 2 tabs today; then begin one tab once daily for 4 more days. 01/13/16   [name], MD  fluticasone [facility]) 50 MCG/ACT nasal spray Place two sprays in each nostril once daily 01/13/16   [name], MD  ibuprofen (ADVIL,MOTRIN) 200 MG tablet Take 200 mg by mouth every 6 (six) hours as needed.    Historical Provider, MD  predniSONE (DELTASONE) 20 MG tablet Take 1 tablet (20 mg total) by mouth 2 (two) times daily. Take with food. 01/13/16   [name], MD   Meds Ordered and  Administered this Visit  Medications - No data to display  BP 125/80 mmHg  Pulse 92  Temp(Src) 98.3 F (36.8 C) (Oral)  Resp 14  Wt 161 lb (73.029 kg)  SpO2 97% No data found.   Physical Exam Nursing notes and Vital Signs reviewed. Appearance:  Patient appears stated age, and in no acute distress Eyes:  Pupils are equal, round, and reactive to light and accomodation.  Extraocular movement is intact.  Conjunctivae are not inflamed  Ears:  Canals normal.  Tympanic membranes normal.  Nose:  Congested turbinates.  There is tenderness to palpation over bridge of nose  Pharynx:  Normal Neck:  Supple.  Tender enlarged posterior nodes are palpated bilaterally  Lungs:  Clear to auscultation.  Breath sounds are equal.  Moving air well. Heart:  Regular rate and rhythm without murmurs, rubs, or gallops.  Abdomen:  Nontender without masses or hepatosplenomegaly.  Bowel sounds are present.  No CVA or flank tenderness.  Extremities:  No edema.  Skin:  No rash present.   ED Course  Procedures none    MDM   1. Acute ethmoidal sinusitis, recurrence not specified    Begin prednisone burst and Z-pak. Take Pseudoephedrine (30mg , one or two every 4 to 6 hours) for sinus congestion.  Get adequate rest.   May use Afrin nasal spray (or generic oxymetazoline) twice daily for about 5 days  and then discontinue.  Also recommend using saline nasal spray several times daily and saline nasal irrigation (AYR is a common brand).  Use Flonase nasal spray each morning after using Afrin nasal spray and saline nasal irrigation. Stop all antihistamines for now, and other non-prescription cough/cold preparations. Followup with Family Doctor if not improved in one week.     [name], MD 01/13/16 2022  [name], MD 01/16/16 [phone]

## 2016-03-31 ENCOUNTER — Encounter: Payer: Self-pay | Admitting: Emergency Medicine

## 2016-03-31 ENCOUNTER — Emergency Department
Admission: EM | Admit: 2016-03-31 | Discharge: 2016-03-31 | Disposition: A | Discharge status: Home / Self Care | Payer: BLUE CROSS/BLUE SHIELD | Source: Home / Self Care | Attending: Family Medicine | Admitting: Family Medicine

## 2016-03-31 DIAGNOSIS — L729 Follicular cyst of the skin and subcutaneous tissue, unspecified: Secondary | ICD-10-CM

## 2016-03-31 DIAGNOSIS — L089 Local infection of the skin and subcutaneous tissue, unspecified: Secondary | ICD-10-CM

## 2016-03-31 MED ORDER — DOXYCYCLINE HYCLATE 100 MG PO CAPS: 100.0000 mg | ORAL_CAPSULE | Freq: Two times a day (BID) | ORAL | Status: DC

## 2016-03-31 MED ORDER — ACETAMINOPHEN 325 MG PO TABS
650.0000 mg | ORAL_TABLET | Freq: Once | ORAL | Status: AC
  Administered 2016-03-31: 650 mg via ORAL

## 2016-03-31 NOTE — ED Provider Notes (Signed)
CSN: [id number]     Arrival date & time 03/31/16  1016 History   First MD Initiated Contact with Patient 03/31/16 1017     Chief Complaint  Patient presents with  . Recurrent Skin Infections   (Consider location/radiation/quality/duration/timing/severity/associated sxs/prior Treatment) HPI The pt is a 55yo male presenting to [facility] with c/o a boil on his Left shoulder that has gradually been worsening for the last 5 days.  Pain is aching and sore, worse with palpation. He has been using warm compresses and noticed some drainage but denies fever, chills, n/v/d. 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  He has had them drained in the past.   Past Medical History  Diagnosis Date  . Arthritis    History reviewed. No pertinent past surgical history. Family History  Problem Relation Age of Onset  . Cancer Mother    Social History  Substance Use Topics  . Smoking status: Current Every Day Smoker -- 1.00 packs/day    Types: Cigarettes  . Smokeless tobacco: Never Used  . Alcohol Use: No    Review of Systems  Constitutional: Negative for fever and chills.  Gastrointestinal: Negative for nausea, vomiting and diarrhea.  Musculoskeletal: Positive for myalgias and arthralgias. Negative for joint swelling, neck pain and neck stiffness.       Left shoulder where boil is  Skin: Positive for color change and wound. Negative for rash.    Allergies  Review of patient's allergies indicates no known allergies.  Home Medications   Prior to Admission medications   Medication Sig Start Date End Date Taking? Authorizing Provider  azithromycin (ZITHROMAX Z-PAK) 250 MG tablet Take 2 tabs today; then begin one tab once daily for 4 more days. 01/13/16   [name], MD  doxycycline (VIBRAMYCIN) 100 MG capsule Take 1 capsule (100 mg total) by mouth 2 (two) times daily. One po bid x 7 days 03/31/16   Noland Fordyce, PA-C  fluticasone [facility]) 50 MCG/ACT nasal spray Place two sprays in each nostril once  daily 01/13/16   [name], MD  ibuprofen (ADVIL,MOTRIN) 200 MG tablet Take 200 mg by mouth every 6 (six) hours as needed.    Historical Provider, MD  predniSONE (DELTASONE) 20 MG tablet Take 1 tablet (20 mg total) by mouth 2 (two) times daily. Take with food. 01/13/16   [name], MD   Meds Ordered and Administered this Visit   Medications  acetaminophen (TYLENOL) tablet 650 mg (650 mg Oral Given 03/31/16 1047)    BP 106/69 mmHg  Pulse 88  Temp(Src) 97.6 F (36.4 C) (Oral)  Wt 164 lb (74.39 kg)  SpO2 98% No data found.   Physical Exam  Constitutional: He is oriented to person, place, and time. He appears well-developed and well-nourished.  HENT:  Head: Normocephalic and atraumatic.  Eyes: EOM are normal.  Neck: Normal range of motion.  Cardiovascular: Normal rate.   Pulmonary/Chest: Effort normal.  Musculoskeletal: Normal range of motion. He exhibits tenderness. He exhibits no edema.       Arms: Neurological: He is alert and oriented to person, place, and time.  Skin: Skin is warm and dry. There is erythema.  Superior Left shoulder: Quarter sized area of erythema, edema and centralized opening with thick white-yellow spontaneous discharge. Tender to touch.   Psychiatric: He has a normal mood and affect. His behavior is normal.  Nursing note and vitals reviewed.   ED Course  .Marland KitchenIncision and Drainage Date/Time: 03/31/2016 10:54 AM Performed by:  O'MALLEY, Shanquita Ronning Authorized by: Theone Murdoch A Consent: Verbal consent obtained. Risks and benefits: risks, benefits and alternatives were discussed Consent given by: patient Patient understanding: patient states understanding of the procedure being performed Patient consent: the patient's understanding of the procedure matches consent given Required items: required blood products, implants, devices, and special equipment available Patient identity confirmed: verbally with patient Type: cyst Body area: upper  extremity Location details: left shoulder Anesthesia: local infiltration Local anesthetic: lidocaine 1% with epinephrine Patient sedated: no Needle gauge: 22 Incision type: already spontaneously draining. Complexity: simple Drainage: purulent and  bloody (thick-white cottage cheese appearance) Drainage amount: moderate Packing material: 1/4 in iodoform gauze Patient tolerance: Patient tolerated the procedure well with no immediate complications   (including critical care time)  Labs Review Labs Reviewed - No data to display  Imaging Review No results found.   MDM   1. Infected cyst of skin    Pt presenting to [facility] with increased cyst on Left shoulder. Discharge c/w infected cyst.  Rx: Doxycycline Home care instructions provided. F/u in 2-3 days to [facility] for wound recheck and packing removal. Patient verbalized understanding and agreement with treatment plan.    Noland Fordyce, PA-C 03/31/16 1248

## 2016-03-31 NOTE — ED Notes (Signed)
Pt c/o boil on left shoulder x5 days.

## 2016-03-31 NOTE — Discharge Instructions (Signed)
You may continue to use warm compresses such as a warm damp washcloth.  Take antibiotics as prescribed.  You may take over the counter acetaminophen and ibuprofen as needed for pain.

## 2016-04-02 ENCOUNTER — Encounter: Payer: Self-pay | Admitting: *Deleted

## 2016-04-02 ENCOUNTER — Emergency Department (INDEPENDENT_AMBULATORY_CARE_PROVIDER_SITE_OTHER)
Admission: EM | Admit: 2016-04-02 | Discharge: 2016-04-02 | Disposition: A | Discharge status: Home / Self Care | Payer: BLUE CROSS/BLUE SHIELD | Source: Home / Self Care | Attending: Family Medicine | Admitting: Family Medicine

## 2016-04-02 DIAGNOSIS — Z4800 Encounter for change or removal of nonsurgical wound dressing: Secondary | ICD-10-CM

## 2016-04-02 NOTE — ED Notes (Signed)
Micheal Williams is here today for a wound recheck of his LT shoulder abscess.

## 2016-04-02 NOTE — ED Provider Notes (Signed)
CSN: [id number]     Arrival date & time 04/02/16  1902 History   First MD Initiated Contact with Patient 04/02/16 1913     Chief Complaint  Patient presents with  . Wound Check      HPI Comments: Patient returns for dressing change left shoulder area.  He denies pain.  The history is provided by the patient.    Past Medical History  Diagnosis Date  . Arthritis    History reviewed. No pertinent past surgical history. Family History  Problem Relation Age of Onset  . Cancer Mother    Social History  Substance Use Topics  . Smoking status: Current Every Day Smoker -- 1.00 packs/day    Types: Cigarettes  . Smokeless tobacco: Never Used  . Alcohol Use: No    Review of Systems No fevers, chills, and sweats. Allergies  Review of patient's allergies indicates no known allergies.  Home Medications   Prior to Admission medications   Medication Sig Start Date End Date Taking? Authorizing Provider  azithromycin (ZITHROMAX Z-PAK) 250 MG tablet Take 2 tabs today; then begin one tab once daily for 4 more days. 01/13/16   [name], MD  doxycycline (VIBRAMYCIN) 100 MG capsule Take 1 capsule (100 mg total) by mouth 2 (two) times daily. One po bid x 7 days 03/31/16   Noland Fordyce, PA-C  fluticasone [facility]) 50 MCG/ACT nasal spray Place two sprays in each nostril once daily 01/13/16   [name], MD  ibuprofen (ADVIL,MOTRIN) 200 MG tablet Take 200 mg by mouth every 6 (six) hours as needed.    Historical Provider, MD  predniSONE (DELTASONE) 20 MG tablet Take 1 tablet (20 mg total) by mouth 2 (two) times daily. Take with food. 01/13/16   [name], MD   Meds Ordered and Administered this Visit  Medications - No data to display  BP 122/76 mmHg  Pulse 85  Temp(Src) 98.2 F (36.8 C) (Oral)  Resp 16  SpO2 98% No data found.   Physical Exam Appears in no distress.  Removed bandage and packing over left trapezius area.  No purulent drainage.  No swelling, erythema,  tenderness, or warmth.  Wound cavity approximately 19mm deep.  ED Course  Procedures Dressing change Placed small piece of 1/4inch iodoform packing to maintain wound patency only.  Bandage applied.   MDM   1. Dressing change     Remove present bandage and packing in 24 to 48 hours.  Change bandage daily until healed.  Finish antibiotic    [name], MD 04/02/16 [phone]

## 2016-04-02 NOTE — Discharge Instructions (Signed)
Remove present bandage and packing in 24 to 48 hours.  Change bandage daily until healed.  Finish antibiotic.

## 2016-11-21 ENCOUNTER — Encounter: Payer: Self-pay | Admitting: Emergency Medicine

## 2016-11-21 ENCOUNTER — Emergency Department (INDEPENDENT_AMBULATORY_CARE_PROVIDER_SITE_OTHER)
Admission: EM | Admit: 2016-11-21 | Discharge: 2016-11-21 | Disposition: A | Discharge status: Home / Self Care | Payer: BLUE CROSS/BLUE SHIELD | Source: Home / Self Care | Attending: Family Medicine | Admitting: Family Medicine

## 2016-11-21 DIAGNOSIS — R0981 Nasal congestion: Secondary | ICD-10-CM | POA: Diagnosis not present

## 2016-11-21 DIAGNOSIS — R05 Cough: Secondary | ICD-10-CM

## 2016-11-21 DIAGNOSIS — R69 Illness, unspecified: Principal | ICD-10-CM

## 2016-11-21 DIAGNOSIS — R0982 Postnasal drip: Secondary | ICD-10-CM | POA: Diagnosis not present

## 2016-11-21 DIAGNOSIS — R5383 Other fatigue: Secondary | ICD-10-CM | POA: Diagnosis not present

## 2016-11-21 DIAGNOSIS — R61 Generalized hyperhidrosis: Secondary | ICD-10-CM

## 2016-11-21 DIAGNOSIS — R51 Headache: Secondary | ICD-10-CM

## 2016-11-21 DIAGNOSIS — J111 Influenza due to unidentified influenza virus with other respiratory manifestations: Secondary | ICD-10-CM

## 2016-11-21 DIAGNOSIS — R6883 Chills (without fever): Secondary | ICD-10-CM

## 2016-11-21 DIAGNOSIS — M791 Myalgia: Secondary | ICD-10-CM

## 2016-11-21 MED ORDER — IBUPROFEN 600 MG PO TABS
600.0000 mg | ORAL_TABLET | Freq: Once | ORAL | Status: AC
  Administered 2016-11-21: 600 mg via ORAL

## 2016-11-21 MED ORDER — CEFTRIAXONE SODIUM 500 MG IJ SOLR
500.0000 mg | Freq: Once | INTRAMUSCULAR | Status: AC
  Administered 2016-11-21: 500 mg via INTRAMUSCULAR

## 2016-11-21 MED ORDER — CEFDINIR 300 MG PO CAPS: 300.0000 mg | ORAL_CAPSULE | Freq: Two times a day (BID) | ORAL | 0 refills | Status: DC

## 2016-11-21 MED ORDER — OSELTAMIVIR PHOSPHATE 75 MG PO CAPS: 75.0000 mg | ORAL_CAPSULE | Freq: Two times a day (BID) | ORAL | 0 refills | Status: DC

## 2016-11-21 NOTE — ED Triage Notes (Signed)
Patient has not felt well the past 3-4 days with worsening symptoms past 2 days; chills and aches. No OTCs today.

## 2016-11-21 NOTE — ED Provider Notes (Signed)
[facility]    CSN: [id number] Arrival date & time: 11/21/16  1556     History   Chief Complaint Chief Complaint  Patient presents with  . Generalized Body Aches  . Chills    HPI Micheal Williams is a 56 y.o. male.   Patient developed sinus congestion about 5 days ago.  Yesterday he became suddenly worse with myalgias, malaise, headache, fatigue, chills/sweats, and cough. He continues to smoke.  He has a history of palindromic rheumatism controlled with plaquenil.   The history is provided by the patient.    Past Medical History:  Diagnosis Date  . Arthritis     There are no active problems to display for this patient.   History reviewed. No pertinent surgical history.     Home Medications    Prior to Admission medications   Medication Sig Start Date End Date Taking? Authorizing Provider  hydroxychloroquine (PLAQUENIL) 200 MG tablet Take 200 mg by mouth 2 (two) times daily.   Yes Historical Provider, MD  cefdinir (OMNICEF) 300 MG capsule Take 1 capsule (300 mg total) by mouth 2 (two) times daily. 11/21/16   [name], MD  fluticasone [employer] [facility]) 50 MCG/ACT nasal spray Place two sprays in each nostril once daily 01/13/16   [name], MD  ibuprofen (ADVIL,MOTRIN) 200 MG tablet Take 200 mg by mouth every 6 (six) hours as needed.    Historical Provider, MD  oseltamivir (TAMIFLU) 75 MG capsule Take 1 capsule (75 mg total) by mouth every 12 (twelve) hours. 11/21/16   [name], MD    Family History Family History  Problem Relation Age of Onset  . Cancer Mother     Social History Social History  Substance Use Topics  . Smoking status: Current Every Day Smoker    Packs/day: 1.00    Types: Cigarettes  . Smokeless tobacco: Never Used  . Alcohol use No     Allergies   Patient has no known allergies.   Review of Systems Review of Systems No sore throat + cough No pleuritic pain No wheezing + nasal congestion + post-nasal  drainage No sinus pain/pressure No itchy/red eyes No earache No hemoptysis No SOB No fever, + chills/sweats No nausea No vomiting No abdominal pain No diarrhea No urinary symptoms No skin rash + fatigue + myalgias + headache Used OTC meds without relief   Physical Exam Triage Vital Signs ED Triage Vitals  Enc Vitals Group     BP 11/21/16 1622 114/65     Pulse Rate 11/21/16 1622 107     Resp 11/21/16 1622 16     Temp 11/21/16 1622 99.7 F (37.6 C)     Temp Source 11/21/16 1622 Oral     SpO2 11/21/16 1622 98 %     Weight 11/21/16 1623 161 lb (73 kg)     Height 11/21/16 1623 6' (1.829 m)     Head Circumference --      Peak Flow --      Pain Score 11/21/16 1626 2     Pain Loc --      Pain Edu? --      Excl. in [place]? --    No data found.   Updated Vital Signs BP 114/65 (BP Location: Left Arm)   Pulse 107   Temp 99.7 F (37.6 C) (Oral)   Resp 16   Ht 6' (1.829 m)   Wt 161 lb (73 kg)   SpO2 98%   BMI 21.84  kg/m   Visual Acuity Right Eye Distance:   Left Eye Distance:   Bilateral Distance:    Right Eye Near:   Left Eye Near:    Bilateral Near:     Physical Exam Nursing notes and Vital Signs reviewed. Appearance:  Patient appears stated age, and in no acute distress Eyes:  Pupils are equal, round, and reactive to light and accomodation.  Extraocular movement is intact.  Conjunctivae are not inflamed  Ears:  Canals normal.  Tympanic membranes normal.  Nose:  Mildly congested turbinates.  No sinus tenderness.   Pharynx:  Normal Neck:  Supple.  Mildly tender enlarged posterior/lateral nodes are palpated bilaterally  Lungs:  Clear to auscultation.  Breath sounds are equal.  Moving air well. Heart:  Regular rate and rhythm without murmurs, rubs, or gallops.  Abdomen:  Nontender without masses or hepatosplenomegaly.  Bowel sounds are present.  No CVA or flank tenderness.  Extremities:  No edema.  Skin:  No rash present.    UC Treatments / Results   Labs (all labs ordered are listed, but only abnormal results are displayed) Labs Reviewed - No data to display  EKG  EKG Interpretation None       Radiology No results found.  Procedures Procedures (including critical care time)  Medications Ordered in UC Medications  cefTRIAXone (ROCEPHIN) injection 500 mg (not administered)  ibuprofen (ADVIL,MOTRIN) tablet 600 mg (600 mg Oral Given 11/21/16 1628)     Initial Impression / Assessment and Plan / UC Course  I have reviewed the triage vital signs and the nursing notes.  Pertinent labs & imaging results that were available during my care of the patient were reviewed by me and considered in my medical decision making (see chart for details).  Clinical Course   Pateint at risk for pneumonia. Begin Tamiflu. Administered Rocephin 500mg  IM Start Omnicef (Cefdinir) 300mg  BID on Thursday 11/22/16. Take plain guaifenesin (1200mg  extended release tabs such as Mucinex) twice daily, with plenty of water, for cough and congestion.  May add Pseudoephedrine (30mg , one or two every 4 to 6 hours) for sinus congestion.  Get adequate rest.   May use Afrin nasal spray (or generic oxymetazoline) twice daily for about 5 days and then discontinue.  Also recommend using saline nasal spray several times daily and saline nasal irrigation (AYR is a common brand).  Use Flonase nasal spray each morning after using Afrin nasal spray and saline nasal irrigation. Try warm salt water gargles for sore throat.  Stop all antihistamines for now, and other non-prescription cough/cold preparations. May take Ibuprofen 200mg , 4 tabs every 8 hours with food for body aches, headache, etc. May take Delsym Cough Suppressant at bedtime for nighttime cough.  Follow-up with family doctor if not improving about one week.     Final Clinical Impressions(s) / UC Diagnoses   Final diagnoses:  Influenza-like illness    New Prescriptions New Prescriptions   CEFDINIR  (OMNICEF) 300 MG CAPSULE    Take 1 capsule (300 mg total) by mouth 2 (two) times daily.   OSELTAMIVIR (TAMIFLU) 75 MG CAPSULE    Take 1 capsule (75 mg total) by mouth every 12 (twelve) hours.     [name], MD 12/04/16 [id number]

## 2016-11-21 NOTE — Discharge Instructions (Signed)
Start Omnicef (Cefdinir) on Thursday 11/22/16. Take plain guaifenesin (1200mg  extended release tabs such as Mucinex) twice daily, with plenty of water, for cough and congestion.  May add Pseudoephedrine (30mg , one or two every 4 to 6 hours) for sinus congestion.  Get adequate rest.   May use Afrin nasal spray (or generic oxymetazoline) twice daily for about 5 days and then discontinue.  Also recommend using saline nasal spray several times daily and saline nasal irrigation (AYR is a common brand).  Use Flonase nasal spray each morning after using Afrin nasal spray and saline nasal irrigation. Try warm salt water gargles for sore throat.  Stop all antihistamines for now, and other non-prescription cough/cold preparations. May take Ibuprofen 200mg , 4 tabs every 8 hours with food for body aches, headache, etc. May take Delsym Cough Suppressant at bedtime for nighttime cough.  Follow-up with family doctor if not improving about one week.

## 2017-05-10 ENCOUNTER — Emergency Department
Admission: EM | Admit: 2017-05-10 | Discharge: 2017-05-10 | Disposition: A | Discharge status: Home / Self Care | Payer: BLUE CROSS/BLUE SHIELD | Source: Home / Self Care | Attending: Family Medicine | Admitting: Family Medicine

## 2017-05-10 DIAGNOSIS — L0202 Furuncle of face: Secondary | ICD-10-CM | POA: Diagnosis not present

## 2017-05-10 MED ORDER — DOXYCYCLINE HYCLATE 100 MG PO CAPS: 100.0000 mg | ORAL_CAPSULE | Freq: Two times a day (BID) | ORAL | 0 refills | Status: DC

## 2017-05-10 NOTE — ED Provider Notes (Signed)
CSN: [id number]     Arrival date & time 05/10/17  1733 History   First MD Initiated Contact with Patient 05/10/17 1751     Chief Complaint  Patient presents with  . Mass   (Consider location/radiation/quality/duration/timing/severity/associated sxs/prior Treatment) HPI Micheal Williams is a 56 y.o. male presenting to UC with c/o 3 days worsening swelling and soreness of a knot on the Left side of his face.  Hx of abscess on his back and chest before. This feels similar but he has not had one on his face before. Denies fever or chills. Pain is 4/10.  He tried to drain it the other day but he thinks he made it worse.    Past Medical History:  Diagnosis Date  . Arthritis    No past surgical history on file. Family History  Problem Relation Age of Onset  . Cancer Mother    Social History  Substance Use Topics  . Smoking status: Current Every Day Smoker    Packs/day: 1.00    Types: Cigarettes  . Smokeless tobacco: Never Used  . Alcohol use No    Review of Systems  Constitutional: Negative for chills and fever.  HENT: Positive for facial swelling (Left cheek).   Musculoskeletal: Negative for myalgias.  Skin: Positive for color change and wound. Negative for rash.    Allergies  Patient has no known allergies.  Home Medications   Prior to Admission medications   Medication Sig Start Date End Date Taking? Authorizing Provider  hydroxychloroquine (PLAQUENIL) 200 MG tablet Take 200 mg by mouth 2 (two) times daily.   Yes [provider]  cefdinir (OMNICEF) 300 MG capsule Take 1 capsule (300 mg total) by mouth 2 (two) times daily. 11/21/16   [name], MD  doxycycline (VIBRAMYCIN) 100 MG capsule Take 1 capsule (100 mg total) by mouth 2 (two) times daily. One po bid x 7 days 05/10/17   Noe Gens, PA-C  fluticasone [facility]) 50 MCG/ACT nasal spray Place two sprays in each nostril once daily 01/13/16   [name], MD  ibuprofen (ADVIL,MOTRIN) 200 MG tablet Take  200 mg by mouth every 6 (six) hours as needed.    [provider]  oseltamivir (TAMIFLU) 75 MG capsule Take 1 capsule (75 mg total) by mouth every 12 (twelve) hours. 11/21/16   [name], MD   Meds Ordered and Administered this Visit  Medications - No data to display  BP 117/80 (BP Location: Left Arm)   Pulse 81   Temp 98.5 F (36.9 C) (Oral)   Resp 18   Ht 6' (1.829 m)   Wt 160 lb 8 oz (72.8 kg)   SpO2 96%   BMI 21.77 kg/m  No data found.   Physical Exam  Constitutional: He is oriented to person, place, and time. He appears well-developed and well-nourished. No distress.  HENT:  Head: Normocephalic and atraumatic.    Pea-sized pustule on face. Centralized scab. No active bleeding or drainage. No red streaking.   Eyes: EOM are normal.  Neck: Normal range of motion.  Cardiovascular: Normal rate.   Pulmonary/Chest: Effort normal.  Musculoskeletal: Normal range of motion.  Neurological: He is alert and oriented to person, place, and time.  Skin: Skin is warm and dry. He is not diaphoretic.  Psychiatric: He has a normal mood and affect. His behavior is normal.  Nursing note and vitals reviewed.   Urgent Care Course     .Marland KitchenIncision and Drainage Date/Time: 05/10/2017 6:51  PM Performed by: Noe Gens Authorized by: Theone Murdoch A   Consent:    Consent obtained:  Verbal   Consent given by:  Patient   Risks discussed:  Bleeding, incomplete drainage, infection and pain   Alternatives discussed:  Delayed treatment (antibiotics and warm compresses first) Location:    Type:  Abscess   Size:  0.5   Location:  Head   Head location:  Face Pre-procedure details:    Procedure prep: alcohol swab. Anesthesia (see MAR for exact dosages):    Anesthesia method:  Local infiltration   Local anesthetic:  Lidocaine 2% WITH epi Procedure type:    Complexity:  Simple Procedure details:    Needle aspiration: no     Incision types:  Single straight   Incision  depth:  Subcutaneous   Scalpel blade:  11   Drainage:  Bloody and purulent   Drainage amount:  Moderate   Wound treatment:  Wound left open   Packing materials:  None Post-procedure details:    Patient tolerance of procedure:  Tolerated well, no immediate complications   (including critical care time)  Labs Review Labs Reviewed - No data to display  Imaging Review No results found.    MDM   1. Boil, face    Abscess on Left side of face.  Offered to try antibiotics and warm compress first. Pt requested I&D of boil.  I&D successfully performed w/o immediate complication  Rx: doxycycline Home care instructions provided F/u as needed     [name] 05/10/17 1854

## 2017-05-10 NOTE — ED Triage Notes (Signed)
56 y.o. Male with mass to the left side of his nose for approx 3 day.

## 2017-09-19 ENCOUNTER — Emergency Department
Admission: EM | Admit: 2017-09-19 | Discharge: 2017-09-19 | Disposition: A | Discharge status: Home / Self Care | Payer: BLUE CROSS/BLUE SHIELD | Source: Home / Self Care | Attending: Family Medicine | Admitting: Family Medicine

## 2017-09-19 ENCOUNTER — Other Ambulatory Visit: Payer: Self-pay

## 2017-09-19 DIAGNOSIS — J069 Acute upper respiratory infection, unspecified: Secondary | ICD-10-CM

## 2017-09-19 MED ORDER — METHYLPREDNISOLONE SODIUM SUCC 40 MG IJ SOLR
80.0000 mg | Freq: Once | INTRAMUSCULAR | Status: AC
  Administered 2017-09-19: 80 mg via INTRAMUSCULAR

## 2017-09-19 MED ORDER — DOXYCYCLINE HYCLATE 100 MG PO CAPS: 100.0000 mg | ORAL_CAPSULE | Freq: Two times a day (BID) | ORAL | 0 refills | Status: DC

## 2017-09-19 MED ORDER — BENZONATATE 100 MG PO CAPS: 100.0000 mg | ORAL_CAPSULE | Freq: Three times a day (TID) | ORAL | 0 refills | Status: DC

## 2017-09-19 NOTE — ED Provider Notes (Signed)
[name]    CSN: [id number] Arrival date & time: 09/19/17  1239     History   Chief Complaint Chief Complaint  Patient presents with  . Cough  . Fever  . Headache    HPI Micheal Williams is a 56 y.o. male.   HPI Micheal Williams is a 56 y.o. male presenting to UC with c/o 5 days of gradually worsening generalized HA, mild to moderately productive cough that is worse at night and fever last night of 100*F with sinus congestion. He does have a hx of COPD and has needed steroid treatments in the past for his breathing. He has tried OTC nighttime cough medication with mild relief.  His wife has been sick recently with sinus symptoms. He has a granddaughter at home and hopes to not get her sick. Denies n/v/d.     Past Medical History:  Diagnosis Date  . Arthritis     There are no active problems to display for this patient.   History reviewed. No pertinent surgical history.     Home Medications    Prior to Admission medications   Medication Sig Start Date End Date Taking? Authorizing Provider  benzonatate (TESSALON) 100 MG capsule Take 1-2 capsules (100-200 mg total) every 8 (eight) hours by mouth. 09/19/17   Noe Gens, PA-C  cefdinir (OMNICEF) 300 MG capsule Take 1 capsule (300 mg total) by mouth 2 (two) times daily. 11/21/16   [name], MD  doxycycline (VIBRAMYCIN) 100 MG capsule Take 1 capsule (100 mg total) 2 (two) times daily by mouth. One po bid x 7 days 09/19/17   Noe Gens, PA-C  fluticasone [facility]) 50 MCG/ACT nasal spray Place two sprays in each nostril once daily 01/13/16   [name], MD  hydroxychloroquine (PLAQUENIL) 200 MG tablet Take 200 mg by mouth 2 (two) times daily.    [provider]  ibuprofen (ADVIL,MOTRIN) 200 MG tablet Take 200 mg by mouth every 6 (six) hours as needed.    [provider]  oseltamivir (TAMIFLU) 75 MG capsule Take 1 capsule (75 mg total) by mouth every 12 (twelve) hours. 11/21/16    [name], MD    Family History Family History  Problem Relation Age of Onset  . Cancer Mother     Social History Social History   Tobacco Use  . Smoking status: Current Every Day Smoker    Packs/day: 1.00    Types: Cigarettes  . Smokeless tobacco: Never Used  Substance Use Topics  . Alcohol use: No  . Drug use: No     Allergies   Patient has no known allergies.   Review of Systems Review of Systems  Constitutional: Positive for fever. Negative for chills.  HENT: Positive for congestion and sore throat. Negative for ear pain, trouble swallowing and voice change.   Respiratory: Negative for cough and shortness of breath.   Cardiovascular: Negative for chest pain and palpitations.  Gastrointestinal: Negative for abdominal pain, diarrhea, nausea and vomiting.  Musculoskeletal: Negative for arthralgias, back pain and myalgias.  Skin: Negative for rash.  Neurological: Positive for headaches. Negative for dizziness and light-headedness.     Physical Exam Triage Vital Signs ED Triage Vitals  Enc Vitals Group     BP 09/19/17 1258 111/75     Pulse Rate 09/19/17 1258 66     Resp --      Temp 09/19/17 1258 97.7 F (36.5 C)     Temp Source 09/19/17 1258  Oral     SpO2 09/19/17 1258 97 %     Weight 09/19/17 1258 162 lb (73.5 kg)     Height 09/19/17 1258 6\' 1"  (1.854 m)     Head Circumference --      Peak Flow --      Pain Score 09/19/17 1259 0     Pain Loc --      Pain Edu? --      Excl. in [place]? --    No data found.  Updated Vital Signs BP 111/75 (BP Location: Right Arm)   Pulse 66   Temp 97.7 F (36.5 C) (Oral)   Ht 6\' 1"  (1.854 m)   Wt 162 lb (73.5 kg)   SpO2 97%   BMI 21.37 kg/m   Visual Acuity Right Eye Distance:   Left Eye Distance:   Bilateral Distance:    Right Eye Near:   Left Eye Near:    Bilateral Near:     Physical Exam  Constitutional: He is oriented to person, place, and time. He appears well-developed and well-nourished.   Non-toxic appearance. He does not appear ill. No distress.  HENT:  Head: Normocephalic and atraumatic.  Right Ear: Tympanic membrane normal.  Left Ear: Tympanic membrane normal.  Nose: Nose normal.  Mouth/Throat: Uvula is midline, oropharynx is clear and moist and mucous membranes are normal.  Eyes: EOM are normal.  Neck: Normal range of motion. Neck supple.  Cardiovascular: Normal rate and regular rhythm.  Pulmonary/Chest: Effort normal and breath sounds normal. No respiratory distress. He has no wheezes.  Musculoskeletal: Normal range of motion.  Neurological: He is alert and oriented to person, place, and time.  Skin: Skin is warm and dry.  Psychiatric: He has a normal mood and affect. His behavior is normal.  Nursing note and vitals reviewed.    UC Treatments / Results  Labs (all labs ordered are listed, but only abnormal results are displayed) Labs Reviewed - No data to display  EKG  EKG Interpretation None       Radiology No results found.  Procedures Procedures (including critical care time)  Medications Ordered in UC Medications  methylPREDNISolone sodium succinate (SOLU-MEDROL) 40 mg/mL injection 80 mg (80 mg Intramuscular Given 09/19/17 1320)     Initial Impression / Assessment and Plan / UC Course  I have reviewed the triage vital signs and the nursing notes.  Pertinent labs & imaging results that were available during my care of the patient were reviewed by me and considered in my medical decision making (see chart for details).     Pt c/o worsening URI symptoms including productive cough that is worse at night.  Lungs: CTAB in UC, however, due to hx of complications with his COPD and worsening symptoms with fever last night, will start on doxycycline and give solumedrol 80mg  IM in UC today F/u with PCP in 1 week if not improving.   Final Clinical Impressions(s) / UC Diagnoses   Final diagnoses:  Upper respiratory tract infection, unspecified type      ED Discharge Orders        Ordered    doxycycline (VIBRAMYCIN) 100 MG capsule  2 times daily     09/19/17 1315    benzonatate (TESSALON) 100 MG capsule  Every 8 hours     09/19/17 1315       Controlled Substance Prescriptions [place] Controlled Substance Registry consulted? Not Applicable   [name] 09/19/17 1326

## 2017-09-19 NOTE — ED Triage Notes (Signed)
Started Monday with headache, coughs most all night.  Fever last night, head congestion.

## 2018-06-27 ENCOUNTER — Emergency Department (INDEPENDENT_AMBULATORY_CARE_PROVIDER_SITE_OTHER)
Admission: EM | Admit: 2018-06-27 | Discharge: 2018-06-27 | Disposition: A | Discharge status: Home / Self Care | Payer: BLUE CROSS/BLUE SHIELD | Source: Home / Self Care | Attending: Family Medicine | Admitting: Family Medicine

## 2018-06-27 ENCOUNTER — Other Ambulatory Visit: Payer: Self-pay

## 2018-06-27 DIAGNOSIS — B9789 Other viral agents as the cause of diseases classified elsewhere: Secondary | ICD-10-CM | POA: Diagnosis not present

## 2018-06-27 DIAGNOSIS — J069 Acute upper respiratory infection, unspecified: Secondary | ICD-10-CM

## 2018-06-27 MED ORDER — METHYLPREDNISOLONE SODIUM SUCC 125 MG IJ SOLR
80.0000 mg | Freq: Once | INTRAMUSCULAR | Status: AC
  Administered 2018-06-27: 80 mg via INTRAMUSCULAR

## 2018-06-27 MED ORDER — ALBUTEROL SULFATE 108 (90 BASE) MCG/ACT IN AEPB: 2.0000 | INHALATION_SPRAY | RESPIRATORY_TRACT | 0 refills | Status: AC | PRN

## 2018-06-27 MED ORDER — PREDNISONE 20 MG PO TABS: ORAL_TABLET | ORAL | 0 refills | Status: DC

## 2018-06-27 MED ORDER — DOXYCYCLINE HYCLATE 100 MG PO CAPS: 100.0000 mg | ORAL_CAPSULE | Freq: Two times a day (BID) | ORAL | 0 refills | Status: DC

## 2018-06-27 NOTE — Discharge Instructions (Signed)
Begin prednisone Saturday 06/28/18. Take plain guaifenesin (1200mg  extended release tabs such as Mucinex) twice daily, with plenty of water, for cough and congestion.  May add Pseudoephedrine (30mg , one or two every 4 to 6 hours) for sinus congestion.  Get adequate rest.   Try warm salt water gargles for sore throat.  Stop all antihistamines for now, and other non-prescription cough/cold preparations. May take Delsym Cough Suppressant at bedtime for nighttime cough.  Begin Doxycycline if not improving about one week or if persistent fever develops

## 2018-06-27 NOTE — ED Provider Notes (Signed)
[name]    CSN: [id number] Arrival date & time: 06/27/18  1603     History   Chief Complaint Chief Complaint  Patient presents with  . Cough    HPI Micheal Williams is a 57 y.o. male.   Patient suddenly developed a non-productive cough yesterday with mild sore throat.  He feels tightness in his chest with mild shortness of breath but no wheezing.  He continues to smoke.  The history is provided by the patient.    Past Medical History:  Diagnosis Date  . Arthritis     There are no active problems to display for this patient.   History reviewed. No pertinent surgical history.     Home Medications    Prior to Admission medications   Medication Sig Start Date End Date Taking? Authorizing Provider  Albuterol Sulfate (PROAIR RESPICLICK) 284 (90 Base) MCG/ACT AEPB Inhale 2 puffs into the lungs every 4 (four) hours as needed. 06/27/18   [name], MD  benzonatate (TESSALON) 100 MG capsule Take 1-2 capsules (100-200 mg total) every 8 (eight) hours by mouth. 09/19/17   Noe Gens, PA-C  cefdinir (OMNICEF) 300 MG capsule Take 1 capsule (300 mg total) by mouth 2 (two) times daily. 11/21/16   [name], MD  doxycycline (VIBRAMYCIN) 100 MG capsule Take 1 capsule (100 mg total) by mouth 2 (two) times daily. Take with food (Rx void after 07/07/18) 06/27/18   [name], MD  fluticasone Asencion Islam) 50 MCG/ACT nasal spray Place two sprays in each nostril once daily 01/13/16   [name], MD  hydroxychloroquine (PLAQUENIL) 200 MG tablet Take 200 mg by mouth 2 (two) times daily.    [provider]  ibuprofen (ADVIL,MOTRIN) 200 MG tablet Take 200 mg by mouth every 6 (six) hours as needed.    [provider]  oseltamivir (TAMIFLU) 75 MG capsule Take 1 capsule (75 mg total) by mouth every 12 (twelve) hours. 11/21/16   [name], MD  predniSONE (DELTASONE) 20 MG tablet Take one tab by mouth twice daily for 4 days, then one daily for  3 days. Take with food. 06/27/18   [name], MD    Family History Family History  Problem Relation Age of Onset  . Cancer Mother     Social History Social History   Tobacco Use  . Smoking status: Current Every Day Smoker    Packs/day: 1.00    Types: Cigarettes  . Smokeless tobacco: Never Used  Substance Use Topics  . Alcohol use: No  . Drug use: No     Allergies   Patient has no known allergies.   Review of Systems Review of Systems + sore throat + cough No pleuritic pain No wheezing + nasal congestion + post-nasal drainage + sinus pain/pressure No itchy/red eyes No earache No hemoptysis ? SOB No fever/chills No nausea No vomiting No abdominal pain No diarrhea No urinary symptoms No skin rash + fatigue No myalgias + headache    Physical Exam Triage Vital Signs ED Triage Vitals  Enc Vitals Group     BP      Pulse      Resp      Temp      Temp src      SpO2      Weight      Height      Head Circumference      Peak Flow      Pain Score  Pain Loc      Pain Edu?      Excl. in [place]?    No data found.  Updated Vital Signs BP 117/73 (BP Location: Right Arm)   Pulse (!) 105   Temp 98 F (36.7 C) (Oral)   Ht 6\' 1"  (1.854 m)   Wt 73.5 kg   SpO2 96%   BMI 21.37 kg/m   Visual Acuity Right Eye Distance:   Left Eye Distance:   Bilateral Distance:    Right Eye Near:   Left Eye Near:    Bilateral Near:     Physical Exam Nursing notes and Vital Signs reviewed. Appearance:  Patient appears stated age, and in no acute distress Eyes:  Pupils are equal, round, and reactive to light and accomodation.  Extraocular movement is intact.  Conjunctivae are not inflamed  Ears:  Canals normal.  Tympanic membranes normal.  Nose:  Mildly congested turbinates.  No sinus tenderness.   Pharynx:  Normal Neck:  Supple.  Enlarged posterior/lateral nodes are palpated bilaterally, tender to palpation on the left.   Lungs:   Course breath sounds.   Breath sounds are equal.  Moving air well. Heart:  Regular rate and rhythm without murmurs, rubs, or gallops.  Abdomen:  Nontender without masses or hepatosplenomegaly.  Bowel sounds are present.  No CVA or flank tenderness.  Extremities:  No edema.  Skin:  No rash present.    UC Treatments / Results  Labs (all labs ordered are listed, but only abnormal results are displayed) Labs Reviewed - No data to display  EKG None  Radiology No results found.  Procedures Procedures (including critical care time)  Medications Ordered in UC Medications  methylPREDNISolone sodium succinate (SOLU-MEDROL) 125 mg/2 mL injection 80 mg (80 mg Intramuscular Given 06/27/18 1710)    Initial Impression / Assessment and Plan / UC Course  I have reviewed the triage vital signs and the nursing notes.  Pertinent labs & imaging results that were available during my care of the patient were reviewed by me and considered in my medical decision making (see chart for details).    Administered Solumedrol 80mg  IM. Rx for albuterol inhaler. Followup with Family Doctor if not improved in one week.    Final Clinical Impressions(s) / UC Diagnoses   Final diagnoses:  Viral URI with cough     Discharge Instructions     Begin prednisone Saturday 06/28/18. Take plain guaifenesin (1200mg  extended release tabs such as Mucinex) twice daily, with plenty of water, for cough and congestion.  May add Pseudoephedrine (30mg , one or two every 4 to 6 hours) for sinus congestion.  Get adequate rest.   Try warm salt water gargles for sore throat.  Stop all antihistamines for now, and other non-prescription cough/cold preparations. May take Delsym Cough Suppressant at bedtime for nighttime cough.  Begin Doxycycline if not improving about one week or if persistent fever develops  (Given a prescription to hold, with an expiration date)       ED Prescriptions    Medication Sig Dispense Auth. Provider   doxycycline  (VIBRAMYCIN) 100 MG capsule Take 1 capsule (100 mg total) by mouth 2 (two) times daily. Take with food (Rx void after 07/07/18) 14 capsule [name], MD   predniSONE (DELTASONE) 20 MG tablet Take one tab by mouth twice daily for 4 days, then one daily for 3 days. Take with food. 11 tablet [name], MD   Albuterol Sulfate ([place]) 778 (90 Base)  MCG/ACT AEPB Inhale 2 puffs into the lungs every 4 (four) hours as needed. 1 each [name], MD         [name], MD 07/01/18 [phone]

## 2018-06-27 NOTE — ED Triage Notes (Signed)
Cough started yesterday.  Pressure in head

## 2018-12-20 ENCOUNTER — Other Ambulatory Visit: Payer: Self-pay

## 2018-12-20 ENCOUNTER — Emergency Department
Admission: EM | Admit: 2018-12-20 | Discharge: 2018-12-20 | Disposition: A | Discharge status: Home / Self Care | Payer: BLUE CROSS/BLUE SHIELD | Source: Home / Self Care

## 2018-12-20 DIAGNOSIS — L089 Local infection of the skin and subcutaneous tissue, unspecified: Secondary | ICD-10-CM | POA: Diagnosis not present

## 2018-12-20 DIAGNOSIS — L729 Follicular cyst of the skin and subcutaneous tissue, unspecified: Secondary | ICD-10-CM

## 2018-12-20 MED ORDER — DOXYCYCLINE HYCLATE 100 MG PO CAPS: 100.0000 mg | ORAL_CAPSULE | Freq: Two times a day (BID) | ORAL | 0 refills | Status: AC

## 2018-12-20 NOTE — ED Triage Notes (Signed)
Here with boil to left mid chest area. Started x2 days ago; drainage for two days with warm compress. Denies fever,chills. Covered with gauze dressing.

## 2018-12-20 NOTE — ED Provider Notes (Signed)
[facility]    CSN: [id number] Arrival date & time: 12/20/18  1302     History   Chief Complaint Chief Complaint  Patient presents with  . Recurrent Skin Infections    HPI Micheal Williams is a 58 y.o. male.   HPI  Micheal Williams is a 58 y.o. male presenting to UC with c/o 2 days of worsening swelling, redness and pain of a cyst on his chest/abdomen. Hx of recurrent abscesses. He has drained some on its own today after warm compresses. Denies fever, chills, n/v/d.   Past Medical History:  Diagnosis Date  . Arthritis     There are no active problems to display for this patient.   No past surgical history on file.     Home Medications    Prior to Admission medications   Medication Sig Start Date End Date Taking? Authorizing Provider  folic acid (FOLVITE) 1 MG tablet Take 1 mg by mouth daily.   Yes [provider]  methotrexate (RHEUMATREX) 2.5 MG tablet Take 2.5 mg by mouth 4 (four) times a week. Caution:Chemotherapy. Protect from light.   Yes [provider]  Albuterol Sulfate (PROAIR RESPICLICK) 081 (90 Base) MCG/ACT AEPB Inhale 2 puffs into the lungs every 4 (four) hours as needed. 06/27/18   [name], MD  doxycycline (VIBRAMYCIN) 100 MG capsule Take 1 capsule (100 mg total) by mouth 2 (two) times daily for 10 days. 12/20/18 12/30/18  Noe Gens, PA-C  ibuprofen (ADVIL,MOTRIN) 200 MG tablet Take 200 mg by mouth every 6 (six) hours as needed.    [provider]    Family History Family History  Problem Relation Age of Onset  . Cancer Mother     Social History Social History   Tobacco Use  . Smoking status: Current Every Day Smoker    Packs/day: 1.00    Types: Cigarettes  . Smokeless tobacco: Never Used  Substance Use Topics  . Alcohol use: No  . Drug use: No     Allergies   Patient has no known allergies.   Review of Systems Review of Systems  Constitutional: Negative for chills and fever.  Skin:  Positive for color change and wound.     Physical Exam Triage Vital Signs ED Triage Vitals [12/20/18 1324]  Enc Vitals Group     BP 117/75     Pulse Rate 84     Resp      Temp 98.1 F (36.7 C)     Temp Source Oral     SpO2 95 %     Weight 165 lb 12.8 oz (75.2 kg)     Height 6\' 1"  (1.854 m)     Head Circumference      Peak Flow      Pain Score      Pain Loc      Pain Edu?      Excl. in [place]?    No data found.  Updated Vital Signs BP 117/75 (BP Location: Left Arm)   Pulse 84   Temp 98.1 F (36.7 C) (Oral)   Ht 6\' 1"  (1.854 m)   Wt 165 lb 12.8 oz (75.2 kg)   SpO2 95%   BMI 21.87 kg/m   Visual Acuity Right Eye Distance:   Left Eye Distance:   Bilateral Distance:    Right Eye Near:   Left Eye Near:    Bilateral Near:     Physical Exam Vitals signs and nursing note reviewed.  Constitutional:      Appearance: He is well-developed.  HENT:     Head: Normocephalic and atraumatic.  Neck:     Musculoskeletal: Normal range of motion.  Cardiovascular:     Rate and Rhythm: Normal rate.  Pulmonary:     Effort: Pulmonary effort is normal.  Musculoskeletal: Normal range of motion.  Skin:    General: Skin is warm and dry.     Findings: Erythema present.          Comments: Multiple scars and cysts noted on trunk.  One cyst appears erythematous, tender, scant purulent discharge present. Mild fluctuance.  Neurological:     Mental Status: He is alert and oriented to person, place, and time.  Psychiatric:        Behavior: Behavior normal.      UC Treatments / Results  Labs (all labs ordered are listed, but only abnormal results are displayed) Labs Reviewed - No data to display  EKG None  Radiology No results found.  Procedures Incision and Drainage Date/Time: 12/20/2018 2:16 PM Performed by: Noe Gens, PA-C Authorized by: Noe Gens, PA-C   Consent:    Consent obtained:  Verbal   Consent given by:  Patient   Risks discussed:  Bleeding,  incomplete drainage, infection and pain   Alternatives discussed:  Delayed treatment Location:    Type:  Cyst   Size:  1cm   Location:  Trunk   Trunk location:  Abdomen Pre-procedure details:    Skin preparation:  Betadine Anesthesia (see MAR for exact dosages):    Anesthesia method:  Local infiltration   Local anesthetic:  Lidocaine 1% WITH epi Procedure type:    Complexity:  Simple Procedure details:    Incision types:  Single straight   Incision depth:  Dermal   Scalpel blade:  11   Wound management:  Probed and deloculated and irrigated with saline   Drainage:  Bloody and purulent   Drainage amount:  Moderate   Wound treatment:  Wound left open   Packing materials:  None Post-procedure details:    Patient tolerance of procedure:  Tolerated well, no immediate complications   (including critical care time)  Medications Ordered in UC Medications - No data to display  Initial Impression / Assessment and Plan / UC Course  I have reviewed the triage vital signs and the nursing notes.  Pertinent labs & imaging results that were available during my care of the patient were reviewed by me and considered in my medical decision making (see chart for details).     Hx and exam c/w infected cyst. I&D performed Will start pt on doxycycline  Final Clinical Impressions(s) / UC Diagnoses   Final diagnoses:  Infected cyst of skin     Discharge Instructions      Please take antibiotics as prescribed and be sure to complete entire course even if you start to feel better to ensure infection does not come back.     ED Prescriptions    Medication Sig Dispense Auth. Provider   doxycycline (VIBRAMYCIN) 100 MG capsule Take 1 capsule (100 mg total) by mouth 2 (two) times daily for 10 days. 20 capsule Noe Gens, PA-C     Controlled Substance Prescriptions [place] Controlled Substance Registry consulted? Not Applicable   [name] 12/20/18 1417

## 2018-12-20 NOTE — Discharge Instructions (Signed)
°  Please take antibiotics as prescribed and be sure to complete entire course even if you start to feel better to ensure infection does not come back. ° °

## 2020-02-17 ENCOUNTER — Emergency Department
Admission: EM | Admit: 2020-02-17 | Discharge: 2020-02-17 | Disposition: A | Discharge status: Home / Self Care | Payer: BC Managed Care – PPO | Source: Home / Self Care

## 2020-02-17 ENCOUNTER — Other Ambulatory Visit: Payer: Self-pay

## 2020-02-17 DIAGNOSIS — J340 Abscess, furuncle and carbuncle of nose: Secondary | ICD-10-CM

## 2020-02-17 MED ORDER — DOXYCYCLINE HYCLATE 100 MG PO CAPS: 100.0000 mg | ORAL_CAPSULE | Freq: Two times a day (BID) | ORAL | 0 refills | Status: DC

## 2020-02-17 MED ORDER — MUPIROCIN 2 % EX OINT: TOPICAL_OINTMENT | CUTANEOUS | 0 refills | Status: AC

## 2020-02-17 NOTE — Discharge Instructions (Signed)
  Be sure to keep applying a warm damp washcloth to your nose 2-3 times daily for 15 minutes at a time. After you have applied the warm compress, you may apply the prescribed antibiotic ointment. Be sure to also take the oral antibiotic, doxycycline as prescribed to help with the infection.  Follow up with family medicine in 5-7 days if not improving, sooner if symptoms worsening and you are concerned area needs to be cut open in the office but it is best if the area comes to a head and drains on its own when it is on the face to limit scaring.

## 2020-02-17 NOTE — ED Triage Notes (Signed)
Patient presents to Urgent Care with complaints of small abscess/ possible ingrown hair on his nose since the past three days. Patient reports he has been using warm compresses to reduce it but that has not helped.

## 2020-02-17 NOTE — ED Provider Notes (Signed)
[name]    CSN: [id number] Arrival date & time: 02/17/20  1404      History   Chief Complaint Chief Complaint  Patient presents with  . Abscess    HPI Yurem Smutny is a 59 y.o. male.   HPI Rushil Solazzo is a 59 y.o. male presenting to UC with c/o 3 days gradually worsening redness and swelling on the bridge of his nose.  He is concerned it may be an infection. Hx of recurrent skin infections elsewhere on his body in the past. Denies fever or chills. He has used warm compresses with mild relief.     Past Medical History:  Diagnosis Date  . Arthritis     There are no problems to display for this patient.   History reviewed. No pertinent surgical history.     Home Medications    Prior to Admission medications   Medication Sig Start Date End Date Taking? Authorizing Provider  Albuterol Sulfate (PROAIR RESPICLICK) 123XX123 (90 Base) MCG/ACT AEPB Inhale 2 puffs into the lungs every 4 (four) hours as needed. 06/27/18   [name], MD  doxycycline (VIBRAMYCIN) 100 MG capsule Take 1 capsule (100 mg total) by mouth 2 (two) times daily. One po bid x 7 days 02/17/20   Noe Gens, PA-C  folic acid (FOLVITE) 1 MG tablet Take 1 mg by mouth daily.    [provider]  ibuprofen (ADVIL,MOTRIN) 200 MG tablet Take 200 mg by mouth every 6 (six) hours as needed.    [provider]  methotrexate (RHEUMATREX) 2.5 MG tablet Take 2.5 mg by mouth 4 (four) times a week. Caution:Chemotherapy. Protect from light.    [provider]  mupirocin ointment (BACTROBAN) 2 % Apply to wound 3 times daily for 5 days 02/17/20   Noe Gens, PA-C    Family History Family History  Problem Relation Age of Onset  . Cancer Mother     Social History Social History   Tobacco Use  . Smoking status: Current Every Day Smoker    Packs/day: 1.00    Types: Cigarettes  . Smokeless tobacco: Never Used  Substance Use Topics  . Alcohol use: No  . Drug use: No      Allergies   Patient has no known allergies.   Review of Systems Review of Systems  Constitutional: Negative for fever.  HENT: Positive for facial swelling (bridge of nose).   Skin: Positive for color change. Negative for wound.     Physical Exam Triage Vital Signs ED Triage Vitals  Enc Vitals Group     BP 02/17/20 1413 111/76     Pulse Rate 02/17/20 1413 79     Resp 02/17/20 1413 16     Temp 02/17/20 1413 98 F (36.7 C)     Temp Source 02/17/20 1413 Oral     SpO2 02/17/20 1413 100 %     Weight --      Height --      Head Circumference --      Peak Flow --      Pain Score 02/17/20 1412 0     Pain Loc --      Pain Edu? --      Excl. in [place]? --    No data found.  Updated Vital Signs BP 111/76 (BP Location: Right Arm)   Pulse 79   Temp 98 F (36.7 C) (Oral)   Resp 16   SpO2 100%   Visual Acuity Right  Eye Distance:   Left Eye Distance:   Bilateral Distance:    Right Eye Near:   Left Eye Near:    Bilateral Near:     Physical Exam Vitals and nursing note reviewed.  Constitutional:      Appearance: Normal appearance. He is well-developed.  HENT:     Head: Normocephalic and atraumatic.   Cardiovascular:     Rate and Rhythm: Normal rate.  Pulmonary:     Effort: Pulmonary effort is normal.  Musculoskeletal:        General: Normal range of motion.     Cervical back: Normal range of motion.  Skin:    General: Skin is warm and dry.  Neurological:     Mental Status: He is alert and oriented to person, place, and time.  Psychiatric:        Behavior: Behavior normal.      UC Treatments / Results  Labs (all labs ordered are listed, but only abnormal results are displayed) Labs Reviewed - No data to display  EKG   Radiology No results found.  Procedures Procedures (including critical care time)  Medications Ordered in UC Medications - No data to display  Initial Impression / Assessment and Plan / UC Course  I have reviewed the triage vital  signs and the nursing notes.  Pertinent labs & imaging results that were available during my care of the patient were reviewed by me and considered in my medical decision making (see chart for details).     Hx and exam c/w early skin abscess Will tx with topical and oral antibiotics. AVS provided  Final Clinical Impressions(s) / UC Diagnoses   Final diagnoses:  Cellulitis of nose  Abscess of external nose     Discharge Instructions      Be sure to keep applying a warm damp washcloth to your nose 2-3 times daily for 15 minutes at a time. After you have applied the warm compress, you may apply the prescribed antibiotic ointment. Be sure to also take the oral antibiotic, doxycycline as prescribed to help with the infection.  Follow up with family medicine in 5-7 days if not improving, sooner if symptoms worsening and you are concerned area needs to be cut open in the office but it is best if the area comes to a head and drains on its own when it is on the face to limit scaring.     ED Prescriptions    Medication Sig Dispense Auth. Provider   doxycycline (VIBRAMYCIN) 100 MG capsule Take 1 capsule (100 mg total) by mouth 2 (two) times daily. One po bid x 7 days 14 capsule Gerarda Fraction, Xavia Kniskern O, PA-C   mupirocin ointment (BACTROBAN) 2 % Apply to wound 3 times daily for 5 days 22 g Noe Gens, [place]     PDMP not reviewed this encounter.   Noe Gens, PA-C 02/17/20 1600

## 2020-10-19 ENCOUNTER — Other Ambulatory Visit: Payer: Self-pay

## 2020-10-19 ENCOUNTER — Encounter: Payer: Self-pay | Admitting: Emergency Medicine

## 2020-10-19 ENCOUNTER — Emergency Department
Admission: EM | Admit: 2020-10-19 | Discharge: 2020-10-19 | Disposition: A | Discharge status: Home / Self Care | Payer: BC Managed Care – PPO | Source: Home / Self Care | Attending: Family Medicine | Admitting: Family Medicine

## 2020-10-19 DIAGNOSIS — L720 Epidermal cyst: Secondary | ICD-10-CM | POA: Diagnosis not present

## 2020-10-19 DIAGNOSIS — L089 Local infection of the skin and subcutaneous tissue, unspecified: Secondary | ICD-10-CM

## 2020-10-19 MED ORDER — DOXYCYCLINE HYCLATE 100 MG PO CAPS: 100.0000 mg | ORAL_CAPSULE | Freq: Two times a day (BID) | ORAL | 0 refills | Status: DC

## 2020-10-19 NOTE — ED Triage Notes (Signed)
Pt c/o abscess to R thigh x 3 days

## 2020-10-20 NOTE — ED Provider Notes (Signed)
  [place]   [id number] 10/19/20 Arrival Time: 6222  ASSESSMENT & PLAN:  1. Infected epidermoid cyst     Incision and Drainage Procedure Note  Anesthesia: 2% plain lidocaine  Procedure Details  The procedure, risks and complications have been discussed in detail (including, but not limited to pain and bleeding) with the patient.  The skin induration was prepped and draped in the usual fashion. After adequate local anesthesia, I&D with a #11 blade was performed on the right posterior thigh with copious, purulent and cystic drainage.  EBL: minimal Drains: none Packing: none Condition: Tolerated procedure well Complications: none.  Begin: Meds ordered this encounter  Medications  . doxycycline (VIBRAMYCIN) 100 MG capsule    Sig: Take 1 capsule (100 mg total) by mouth 2 (two) times daily.    Dispense:  14 capsule    Refill:  0    OTC analgesics as needed.   Follow-up Information    [facility].   Specialty: Urgent Care Why: If worsening or failing to improve as anticipated. Contact information: [place] [address] [place] [phone]              Reviewed expectations re: course of current medical issues. Questions answered. Outlined signs and symptoms indicating need for more acute intervention. Patient verbalized understanding. After Visit Summary given.   SUBJECTIVE:  Micheal Williams is a 59 y.o. male who presents with a possible infection of his R posterior thigh. Onset gradual, approximately several days ago without active drainage and without active bleeding. Symptoms have progressed to a point and plateaued since beginning. Fever: absent. OTC/home treatment: none.   OBJECTIVE:  Vitals:   10/19/20 1753  BP: 124/84  Pulse: 95  Resp: 18  Temp: 98.6 F (37 C)  TempSrc: Oral  SpO2: 97%     General appearance: alert; no distress R posterior thigh: approx 1.5 cm  induration; tender to touch; no active drainage or bleeding; surrounding erythema Psychological: alert and cooperative; normal mood and affect  No Known Allergies  Past Medical History:  Diagnosis Date  . Arthritis    Social History   Socioeconomic History  . Marital status: Married    Spouse name: Not on file  . Number of children: Not on file  . Years of education: Not on file  . Highest education level: Not on file  Occupational History  . Not on file  Tobacco Use  . Smoking status: Current Every Day Smoker    Packs/day: 1.00    Types: Cigarettes  . Smokeless tobacco: Never Used  Substance and Sexual Activity  . Alcohol use: No  . Drug use: No  . Sexual activity: Not on file  Other Topics Concern  . Not on file  Social History Narrative  . Not on file   Social Determinants of Health   Financial Resource Strain: Not on file  Food Insecurity: Not on file  Transportation Needs: Not on file  Physical Activity: Not on file  Stress: Not on file  Social Connections: Not on file   Family History  Problem Relation Age of Onset  . Cancer Mother    History reviewed. No pertinent surgical history.         Vanessa Kick, MD 10/20/20 [phone]

## 2021-04-26 ENCOUNTER — Other Ambulatory Visit: Payer: Self-pay

## 2021-04-26 ENCOUNTER — Emergency Department (INDEPENDENT_AMBULATORY_CARE_PROVIDER_SITE_OTHER)
Admission: EM | Admit: 2021-04-26 | Discharge: 2021-04-26 | Disposition: A | Discharge status: Home / Self Care | Payer: BC Managed Care – PPO | Source: Home / Self Care

## 2021-04-26 ENCOUNTER — Encounter: Payer: Self-pay | Admitting: Emergency Medicine

## 2021-04-26 DIAGNOSIS — L089 Local infection of the skin and subcutaneous tissue, unspecified: Secondary | ICD-10-CM | POA: Diagnosis not present

## 2021-04-26 DIAGNOSIS — L720 Epidermal cyst: Secondary | ICD-10-CM

## 2021-04-26 MED ORDER — DOXYCYCLINE HYCLATE 100 MG PO CAPS: 100.0000 mg | ORAL_CAPSULE | Freq: Two times a day (BID) | ORAL | 0 refills | Status: AC

## 2021-04-26 NOTE — ED Triage Notes (Signed)
Patient c/o an abscess on the left side of chest x 3-4 days.  The area is red and painful.  Patient has applied hot compresses and taken Tylenol for pain.

## 2021-04-26 NOTE — Discharge Instructions (Addendum)
Take antibiotics as prescribed. If minimal improvement in 2-3 days return to care for possible drainage.

## 2021-04-26 NOTE — ED Provider Notes (Signed)
[name]    CSN: [id number] Arrival date & time: 04/26/21  1854      History   Chief Complaint Chief Complaint  Patient presents with   Abscess    HPI Micheal Williams is a 60 y.o. male.   Patient presents with concerns of possible abscess to the left lower chest. He reports he has many cysts on his chest and this one started to become red and painful about 3 days ago and has progressively gotten worse. He has tried hot compress and Tylenol without improvement. He denies any drainage.   The history is provided by the patient.  Abscess Associated symptoms: no fatigue, no fever, no headaches, no nausea and no vomiting    Past Medical History:  Diagnosis Date   Arthritis     There are no problems to display for this patient.   History reviewed. No pertinent surgical history.     Home Medications    Prior to Admission medications   Medication Sig Start Date End Date Taking? Authorizing Provider  Albuterol Sulfate (PROAIR RESPICLICK) 782 (90 Base) MCG/ACT AEPB Inhale 2 puffs into the lungs every 4 (four) hours as needed. 06/27/18  Yes [name], MD  doxycycline (VIBRAMYCIN) 100 MG capsule Take 1 capsule (100 mg total) by mouth 2 (two) times daily for 7 days. 04/26/21 05/03/21 Yes Adiva Boettner L, PA  folic acid (FOLVITE) 1 MG tablet Take 1 mg by mouth daily.   Yes [provider]  ibuprofen (ADVIL,MOTRIN) 200 MG tablet Take 200 mg by mouth every 6 (six) hours as needed.   Yes [provider]  methotrexate (RHEUMATREX) 2.5 MG tablet Take 2.5 mg by mouth 4 (four) times a week. Caution:Chemotherapy. Protect from light.   Yes [provider]  mupirocin ointment (BACTROBAN) 2 % Apply to wound 3 times daily for 5 days 02/17/20  Yes Noe Gens, PA-C    Family History Family History  Problem Relation Age of Onset   Cancer Mother     Social History Social History   Tobacco Use   Smoking status: Every Day    Packs/day: 1.00     Pack years: 0.00    Types: Cigarettes   Smokeless tobacco: Never  Substance Use Topics   Alcohol use: No   Drug use: No     Allergies   Patient has no known allergies.   Review of Systems Review of Systems  Constitutional:  Negative for fatigue and fever.  Cardiovascular:  Negative for chest pain.  Gastrointestinal:  Negative for abdominal pain, nausea and vomiting.  Musculoskeletal:  Negative for arthralgias and myalgias.  Skin:  Positive for color change.  Neurological:  Negative for dizziness and headaches.    Physical Exam Triage Vital Signs ED Triage Vitals  Enc Vitals Group     BP 04/26/21 1951 122/77     Pulse Rate 04/26/21 1951 66     Resp --      Temp 04/26/21 1951 (!) 97.4 F (36.3 C)     Temp Source 04/26/21 1951 Oral     SpO2 04/26/21 1951 98 %     Weight --      Height --      Head Circumference --      Peak Flow --      Pain Score 04/26/21 1953 7     Pain Loc --      Pain Edu? --      Excl. in [place]? --  No data found.  Updated Vital Signs BP 122/77 (BP Location: Left Arm)   Pulse 66   Temp (!) 97.4 F (36.3 C) (Oral)   SpO2 98%   Visual Acuity Right Eye Distance:   Left Eye Distance:   Bilateral Distance:    Right Eye Near:   Left Eye Near:    Bilateral Near:     Physical Exam Vitals and nursing note reviewed.  Constitutional:      General: He is not in acute distress. Eyes:     Pupils: Pupils are equal, round, and reactive to light.  Cardiovascular:     Rate and Rhythm: Normal rate and regular rhythm.     Heart sounds: Normal heart sounds.  Pulmonary:     Effort: Pulmonary effort is normal.     Breath sounds: Normal breath sounds.  Skin:    Comments: Numerous cysts to chest and abdomen. Firm cyst to left lower anterior chest with surrounding erythema, approx 3cm diameter. Firm without fluctuance. No visible skin opening and no purulence. Tender to cyst.   Neurological:     Mental Status: He is alert.  Psychiatric:         Mood and Affect: Mood normal.     UC Treatments / Results  Labs (all labs ordered are listed, but only abnormal results are displayed) Labs Reviewed - No data to display  EKG   Radiology No results found.  Procedures Procedures (including critical care time)  Medications Ordered in UC Medications - No data to display  Initial Impression / Assessment and Plan / UC Course  I have reviewed the triage vital signs and the nursing notes.  Pertinent labs & imaging results that were available during my care of the patient were reviewed by me and considered in my medical decision making (see chart for details).     Consistent with epidermoid cyst that has become infected/inflammed. No fluctuance and discussed with patient unsure if anything to drain at this time. Will start abx and discussed fu if no improvement.   E/M: 1 acute uncomplicated illness, no data, moderate risk due to prescription management  Final Clinical Impressions(s) / UC Diagnoses   Final diagnoses:  Infected epidermoid cyst     Discharge Instructions      Take antibiotics as prescribed. If minimal improvement in 2-3 days return to care for possible drainage.      ED Prescriptions     Medication Sig Dispense Auth. Provider   doxycycline (VIBRAMYCIN) 100 MG capsule Take 1 capsule (100 mg total) by mouth 2 (two) times daily for 7 days. 14 capsule Abner Greenspan, Lylah Lantis L, PA      PDMP not reviewed this encounter.   Delsa Sale, [place] 04/26/21 2015

## 2021-04-30 ENCOUNTER — Encounter: Payer: Self-pay | Admitting: Emergency Medicine

## 2021-04-30 ENCOUNTER — Emergency Department
Admission: EM | Admit: 2021-04-30 | Discharge: 2021-04-30 | Disposition: A | Discharge status: Home / Self Care | Payer: BC Managed Care – PPO | Source: Home / Self Care

## 2021-04-30 ENCOUNTER — Other Ambulatory Visit: Payer: Self-pay

## 2021-04-30 DIAGNOSIS — D235 Other benign neoplasm of skin of trunk: Secondary | ICD-10-CM

## 2021-04-30 NOTE — Discharge Instructions (Addendum)
We have drained the infection from the cyst  I have attached information on where to go for follow up to have this removed  Follow up with this office or with primary care if symptoms are persisting.  Follow up in the ER for high fever, trouble swallowing, trouble breathing, other concerning symptoms.

## 2021-04-30 NOTE — ED Provider Notes (Signed)
[name]    CSN: [id number] Arrival date & time: 04/30/21  0831      History   Chief Complaint Chief Complaint  Patient presents with   Abscess    HPI Micheal Williams is a 60 y.o. male.   Reports painful cyst to trunk/abdomen for the last few days. Was seen in this office 3 days ago and was prescribed doxycycline for this. Has been taking this for the last 2 days. Reports that the pain is not getting any better. Has been using warm compresses to no avail. Reports previous symptoms. There are not aggravating or alleviating factors. Denies drainage/bleeding from the area, chills, fever, abdominal pain, nausea, vomiting, diarrhea, rash, other symptoms.   ROS per HPI  The history is provided by the patient.  Abscess  Past Medical History:  Diagnosis Date   Arthritis     There are no problems to display for this patient.   History reviewed. No pertinent surgical history.     Home Medications    Prior to Admission medications   Medication Sig Start Date End Date Taking? Authorizing Provider  Albuterol Sulfate (PROAIR RESPICLICK) 637 (90 Base) MCG/ACT AEPB Inhale 2 puffs into the lungs every 4 (four) hours as needed. 06/27/18  Yes [name], MD  doxycycline (VIBRAMYCIN) 100 MG capsule Take 1 capsule (100 mg total) by mouth 2 (two) times daily for 7 days. 04/26/21 05/03/21 Yes Arruda, Amy L, PA  folic acid (FOLVITE) 1 MG tablet Take 1 mg by mouth daily.   Yes [provider]  ibuprofen (ADVIL,MOTRIN) 200 MG tablet Take 200 mg by mouth every 6 (six) hours as needed.   Yes [provider]  methotrexate (RHEUMATREX) 2.5 MG tablet Take 2.5 mg by mouth 4 (four) times a week. Caution:Chemotherapy. Protect from light.   Yes [provider]  mupirocin ointment (BACTROBAN) 2 % Apply to wound 3 times daily for 5 days 02/17/20  Yes Noe Gens, PA-C    Family History Family History  Problem Relation Age of Onset   Cancer Mother      Social History Social History   Tobacco Use   Smoking status: Every Day    Packs/day: 1.00    Pack years: 0.00    Types: Cigarettes   Smokeless tobacco: Never  Substance Use Topics   Alcohol use: No   Drug use: No     Allergies   Patient has no known allergies.   Review of Systems Review of Systems   Physical Exam Triage Vital Signs ED Triage Vitals  Enc Vitals Group     BP 04/30/21 0848 121/84     Pulse Rate 04/30/21 0848 80     Resp --      Temp 04/30/21 0848 97.7 F (36.5 C)     Temp Source 04/30/21 0848 Oral     SpO2 04/30/21 0848 98 %     Weight --      Height --      Head Circumference --      Peak Flow --      Pain Score 04/30/21 0849 6     Pain Loc --      Pain Edu? --      Excl. in [place]? --    No data found.  Updated Vital Signs BP 121/84 (BP Location: Right Arm)   Pulse 80   Temp 97.7 F (36.5 C) (Oral)   SpO2 98%       Physical Exam  Vitals and nursing note reviewed.  Constitutional:      Appearance: Normal appearance. He is well-developed.  HENT:     Head: Normocephalic and atraumatic.     Nose: Nose normal.     Mouth/Throat:     Mouth: Mucous membranes are moist.     Pharynx: Oropharynx is clear.  Eyes:     Extraocular Movements: Extraocular movements intact.     Conjunctiva/sclera: Conjunctivae normal.     Pupils: Pupils are equal, round, and reactive to light.  Cardiovascular:     Rate and Rhythm: Normal rate and regular rhythm.  Pulmonary:     Effort: Pulmonary effort is normal. No respiratory distress.  Musculoskeletal:        General: Normal range of motion.     Cervical back: Normal range of motion and neck supple.  Skin:    General: Skin is warm and dry.     Capillary Refill: Capillary refill takes less than 2 seconds.       Neurological:     General: No focal deficit present.     Mental Status: He is alert and oriented to person, place, and time.  Psychiatric:        Mood and Affect: Mood normal.         Behavior: Behavior normal.        Thought Content: Thought content normal.     UC Treatments / Results  Labs (all labs ordered are listed, but only abnormal results are displayed) Labs Reviewed - No data to display  EKG   Radiology No results found.  Procedures Incision and Drainage  Date/Time: 04/30/2021 9:35 AM Performed by: Faustino Congress, NP Authorized by: Faustino Congress, NP   Consent:    Consent obtained:  Verbal   Consent given by:  Patient   Risks, benefits, and alternatives were discussed: yes     Risks discussed:  Incomplete drainage and infection   Alternatives discussed:  Alternative treatment Universal protocol:    Procedure explained and questions answered to patient or proxy's satisfaction: yes     Patient identity confirmed:  Verbally with patient and arm band Location:    Type:  Cyst   Size:  3cm   Location:  Trunk   Trunk location:  Abdomen Pre-procedure details:    Skin preparation:  Povidone-iodine Sedation:    Sedation type:  None Anesthesia:    Anesthesia method:  Topical application and local infiltration   Topical anesthetic:  LET   Local anesthetic:  Lidocaine 2% w/o epi Procedure type:    Complexity:  Simple Procedure details:    Incision types:  Stab incision   Incision depth:  Dermal   Wound management:  Probed and deloculated   Drainage:  Bloody and purulent   Drainage amount:  Moderate   Wound treatment:  Wound left open   Packing materials:  None Post-procedure details:    Procedure completion:  Tolerated well, no immediate complications (including critical care time)  Medications Ordered in UC Medications - No data to display  Initial Impression / Assessment and Plan / UC Course  I have reviewed the triage vital signs and the nursing notes.  Pertinent labs & imaging results that were available during my care of the patient were reviewed by me and considered in my medical decision making (see chart for  details).    Dermoid cyst of Trunk  I&D as above Incomplete drainage Discussed that this would likely need surgical intervention Continue doxycycline Follow up with general  surgery/dermatology  Final Clinical Impressions(s) / UC Diagnoses   Final diagnoses:  Dermoid cyst of trunk     Discharge Instructions      We have drained the infection from the cyst  I have attached information on where to go for follow up to have this removed  Follow up with this office or with primary care if symptoms are persisting.  Follow up in the ER for high fever, trouble swallowing, trouble breathing, other concerning symptoms.      ED Prescriptions   None    PDMP not reviewed this encounter.   Faustino Congress, NP 04/30/21 [phone]

## 2021-04-30 NOTE — ED Triage Notes (Signed)
Patient was seen on 04-26-2021 for abscess on the left side of his chest that is no better.  Patient is currently on antibiotics, applying warm compresses and it's not coming to a head.  The area is still red and painful.

## 2023-01-20 ENCOUNTER — Emergency Department (HOSPITAL_BASED_OUTPATIENT_CLINIC_OR_DEPARTMENT_OTHER)
Admission: EM | Admit: 2023-01-20 | Discharge: 2023-01-20 | Discharge status: Against Medical Advice | Payer: BC Managed Care – PPO | Attending: Emergency Medicine | Admitting: Emergency Medicine

## 2023-01-20 ENCOUNTER — Other Ambulatory Visit: Payer: Self-pay

## 2023-01-20 DIAGNOSIS — L02413 Cutaneous abscess of right upper limb: Secondary | ICD-10-CM | POA: Diagnosis present

## 2023-01-20 DIAGNOSIS — Z5321 Procedure and treatment not carried out due to patient leaving prior to being seen by health care provider: Secondary | ICD-10-CM | POA: Insufficient documentation

## 2023-01-20 NOTE — ED Triage Notes (Signed)
Pt arrives with c/o abscess on his right elbow has gotten worse over the last 3 weeks.

## 2023-01-26 ENCOUNTER — Ambulatory Visit
Admission: EM | Admit: 2023-01-26 | Discharge: 2023-01-26 | Disposition: A | Discharge status: Home / Self Care | Payer: BC Managed Care – PPO | Attending: Family Medicine | Admitting: Family Medicine

## 2023-01-26 ENCOUNTER — Encounter: Payer: Self-pay | Admitting: Emergency Medicine

## 2023-01-26 DIAGNOSIS — M06321 Rheumatoid nodule, right elbow: Secondary | ICD-10-CM | POA: Diagnosis not present

## 2023-01-26 DIAGNOSIS — L089 Local infection of the skin and subcutaneous tissue, unspecified: Secondary | ICD-10-CM

## 2023-01-26 MED ORDER — DOXYCYCLINE HYCLATE 100 MG PO CAPS: 100.0000 mg | ORAL_CAPSULE | Freq: Two times a day (BID) | ORAL | 0 refills | Status: AC

## 2023-01-26 NOTE — ED Provider Notes (Signed)
[name]    CSN: [id number] Arrival date & time: 01/26/23  1208      History   Chief Complaint Chief Complaint  Patient presents with   Abscess    HPI Micheal Williams is a 62 y.o. male.   HPI  Patient has rheumatoid arthritis.  He is on methotrexate.  He has a nodule on the right elbow.  It is red and inflamed.  He thinks it may be an abscess or infection.  He has not been told he has a rheumatoid nodule although I suspect that is what this is.  It has been bothering him for about a month.  It has scant drainage  Past Medical History:  Diagnosis Date   Arthritis     There are no problems to display for this patient.   History reviewed. No pertinent surgical history.     Home Medications    Prior to Admission medications   Medication Sig Start Date End Date Taking? Authorizing Provider  Albuterol Sulfate (PROAIR RESPICLICK) 123XX123 (90 Base) MCG/ACT AEPB Inhale 2 puffs into the lungs every 4 (four) hours as needed. 06/27/18  Yes [name], MD  doxycycline (VIBRAMYCIN) 100 MG capsule Take 1 capsule (100 mg total) by mouth 2 (two) times daily. 01/26/23  Yes Raylene Everts, MD  folic acid (FOLVITE) 1 MG tablet Take 1 mg by mouth daily.   Yes [provider]  ibuprofen (ADVIL,MOTRIN) 200 MG tablet Take 200 mg by mouth every 6 (six) hours as needed.   Yes [provider]  methotrexate (RHEUMATREX) 2.5 MG tablet Take 2.5 mg by mouth 4 (four) times a week. Caution:Chemotherapy. Protect from light.   Yes [provider]  mupirocin ointment (BACTROBAN) 2 % Apply to wound 3 times daily for 5 days 02/17/20  Yes Noe Gens, PA-C    Family History Family History  Problem Relation Age of Onset   Cancer Mother     Social History Social History   Tobacco Use   Smoking status: Every Day    Packs/day: 1    Types: Cigarettes   Smokeless tobacco: Never  Substance Use Topics   Alcohol use: No   Drug use: No     Allergies    Patient has no known allergies.   Review of Systems Review of Systems  See HPI Physical Exam Triage Vital Signs ED Triage Vitals  Enc Vitals Group     BP 01/26/23 1238 121/84     Pulse Rate 01/26/23 1238 85     Resp 01/26/23 1238 18     Temp 01/26/23 1238 98.5 F (36.9 C)     Temp Source 01/26/23 1238 Oral     SpO2 01/26/23 1238 98 %     Weight 01/26/23 1240 172 lb (78 kg)     Height 01/26/23 1240 6\' 1"  (1.854 m)     Head Circumference --      Peak Flow --      Pain Score 01/26/23 1240 0     Pain Loc --      Pain Edu? --      Excl. in [place]? --    No data found.  Updated Vital Signs BP 121/84 (BP Location: Left Arm)   Pulse 85   Temp 98.5 F (36.9 C) (Oral)   Resp 18   Ht 6\' 1"  (1.854 m)   Wt 78 kg   SpO2 98%   BMI 22.69 kg/m      Physical  Exam Constitutional:      General: He is not in acute distress.    Appearance: He is well-developed.  HENT:     Head: Normocephalic and atraumatic.  Eyes:     Conjunctiva/sclera: Conjunctivae normal.     Pupils: Pupils are equal, round, and reactive to light.  Cardiovascular:     Rate and Rhythm: Normal rate.  Pulmonary:     Effort: Pulmonary effort is normal. No respiratory distress.  Abdominal:     General: There is no distension.     Palpations: Abdomen is soft.  Musculoskeletal:        General: Normal range of motion.     Cervical back: Normal range of motion.     Comments: Right has a nodule over the distal ulna.  It is mobile.  Firm.  The surface of the nodule has been eroded/abraded and there is infection in the skin.  No fluctuance to suggest abscess.  Skin:    General: Skin is warm and dry.  Neurological:     General: No focal deficit present.     Mental Status: He is alert.      UC Treatments / Results  Labs (all labs ordered are listed, but only abnormal results are displayed) Labs Reviewed - No data to display  EKG   Radiology No results found.  Procedures Procedures (including critical  care time)  Medications Ordered in UC Medications - No data to display  Initial Impression / Assessment and Plan / UC Course  I have reviewed the triage vital signs and the nursing notes.  Pertinent labs & imaging results that were available during my care of the patient were reviewed by me and considered in my medical decision making (see chart for details).     Final Clinical Impressions(s) / UC Diagnoses   Final diagnoses:  Bacterial skin infection of elbow  Rheumatoid nodule of elbow, right (HCC)     Discharge Instructions      Take doxycycline 2 times a day.  Take this antibiotic with food. Warm compresses or warm soaks to area Using antibiotic ointment and Band-Aid on the area Follow-up with your usual primary care doctor    ED Prescriptions     Medication Sig Dispense Auth. Provider   doxycycline (VIBRAMYCIN) 100 MG capsule Take 1 capsule (100 mg total) by mouth 2 (two) times daily. 14 capsule Raylene Everts, MD      PDMP not reviewed this encounter.   Raylene Everts, MD 01/26/23 1425

## 2023-01-26 NOTE — Discharge Instructions (Signed)
Take doxycycline 2 times a day.  Take this antibiotic with food. Warm compresses or warm soaks to area Using antibiotic ointment and Band-Aid on the area Follow-up with your usual primary care doctor

## 2023-01-26 NOTE — ED Triage Notes (Signed)
Patient c/o abscess on right elbow x 1 month.  The area is red and he feels that it needs to be drained.  The area is sore.  Patient has taken Tylenol for pain.

## 2023-01-27 ENCOUNTER — Telehealth: Payer: Self-pay | Admitting: Emergency Medicine

## 2023-01-27 NOTE — Telephone Encounter (Signed)
Spoke with patient states that he's doing well, the area seems to be healing ok.  Patient will continue with current treatment plan and f/u as needed.
